# Patient Record
Sex: Male | Born: 1975 | Race: White | Hispanic: No | Smoking: Never smoker
Health system: Southern US, Community
[De-identification: ages and names within clinical notes are randomized; demographics above are authoritative.]

## PROBLEM LIST (undated history)

## (undated) DIAGNOSIS — E785 Hyperlipidemia, unspecified: Secondary | ICD-10-CM

## (undated) DIAGNOSIS — I10 Essential (primary) hypertension: Secondary | ICD-10-CM

## (undated) HISTORY — DX: Hyperlipidemia, unspecified: E78.5

## (undated) HISTORY — DX: Essential (primary) hypertension: I10

---

## 1997-10-04 ENCOUNTER — Emergency Department (HOSPITAL_COMMUNITY): Admission: EM | Admit: 1997-10-04 | Discharge: 1997-10-04 | Payer: Self-pay | Admitting: Emergency Medicine

## 1998-06-15 ENCOUNTER — Encounter: Payer: Self-pay | Admitting: Emergency Medicine

## 1998-06-15 ENCOUNTER — Emergency Department (HOSPITAL_COMMUNITY): Admission: EM | Admit: 1998-06-15 | Discharge: 1998-06-15 | Payer: Self-pay | Admitting: Emergency Medicine

## 1998-06-24 ENCOUNTER — Emergency Department (HOSPITAL_COMMUNITY): Admission: EM | Admit: 1998-06-24 | Discharge: 1998-06-24 | Payer: Self-pay | Admitting: Emergency Medicine

## 1999-03-17 ENCOUNTER — Emergency Department (HOSPITAL_COMMUNITY): Admission: EM | Admit: 1999-03-17 | Discharge: 1999-03-17 | Payer: Self-pay | Admitting: Internal Medicine

## 2000-08-05 ENCOUNTER — Emergency Department (HOSPITAL_COMMUNITY): Admission: EM | Admit: 2000-08-05 | Discharge: 2000-08-05 | Payer: Self-pay | Admitting: Emergency Medicine

## 2000-08-05 ENCOUNTER — Encounter: Payer: Self-pay | Admitting: Emergency Medicine

## 2000-08-10 ENCOUNTER — Emergency Department (HOSPITAL_COMMUNITY): Admission: EM | Admit: 2000-08-10 | Discharge: 2000-08-10 | Payer: Self-pay | Admitting: Emergency Medicine

## 2001-12-31 ENCOUNTER — Emergency Department (HOSPITAL_COMMUNITY): Admission: EM | Admit: 2001-12-31 | Discharge: 2001-12-31 | Payer: Self-pay | Admitting: *Deleted

## 2002-09-17 ENCOUNTER — Emergency Department (HOSPITAL_COMMUNITY): Admission: EM | Admit: 2002-09-17 | Discharge: 2002-09-17 | Payer: Self-pay | Admitting: Emergency Medicine

## 2003-12-11 ENCOUNTER — Emergency Department (HOSPITAL_COMMUNITY): Admission: EM | Admit: 2003-12-11 | Discharge: 2003-12-12 | Payer: Self-pay | Admitting: *Deleted

## 2006-12-08 ENCOUNTER — Encounter: Admission: RE | Admit: 2006-12-08 | Discharge: 2006-12-29 | Payer: Self-pay | Admitting: Family Medicine

## 2007-02-25 ENCOUNTER — Emergency Department (HOSPITAL_COMMUNITY): Admission: EM | Admit: 2007-02-25 | Discharge: 2007-02-25 | Payer: Self-pay | Admitting: Family Medicine

## 2009-03-16 ENCOUNTER — Emergency Department (HOSPITAL_COMMUNITY): Admission: EM | Admit: 2009-03-16 | Discharge: 2009-03-16 | Payer: Self-pay | Admitting: Family Medicine

## 2011-02-28 LAB — POCT H PYLORI SCREEN: H. PYLORI SCREEN, POC: NEGATIVE

## 2011-04-18 ENCOUNTER — Emergency Department (INDEPENDENT_AMBULATORY_CARE_PROVIDER_SITE_OTHER)
Admission: EM | Admit: 2011-04-18 | Discharge: 2011-04-18 | Disposition: A | Discharge status: Home / Self Care | Payer: Managed Care, Other (non HMO) | Source: Home / Self Care | Attending: Family Medicine | Admitting: Family Medicine

## 2011-04-18 ENCOUNTER — Encounter: Payer: Self-pay | Admitting: *Deleted

## 2011-04-18 DIAGNOSIS — J069 Acute upper respiratory infection, unspecified: Secondary | ICD-10-CM

## 2011-04-18 MED ORDER — AZITHROMYCIN 250 MG PO TABS: 250.0000 mg | ORAL_TABLET | Freq: Every day | ORAL | Status: AC

## 2011-04-18 MED ORDER — GUAIFENESIN-CODEINE 100-10 MG/5ML PO SYRP: 5.0000 mL | ORAL_SOLUTION | Freq: Four times a day (QID) | ORAL | Status: AC | PRN

## 2011-04-18 NOTE — ED Notes (Signed)
Pt  Reports  Symptoms  Of  Cough  Body  Aches     sorethroat  Had  Fever  yest  But not today     Pt   Is  In no  Acute  Distress  Sitting upright on exam table  Speaking in complete  sentances

## 2011-04-18 NOTE — ED Provider Notes (Signed)
History     CSN: [id number] Arrival date & time: 04/18/2011 12:51 PM   First MD Initiated Contact with Patient 04/18/11 1327      Chief Complaint  Patient presents with  . Cough    (Consider location/radiation/quality/duration/timing/severity/associated sxs/prior treatment) Patient is a 35 y.o. male presenting with cough. The history is provided by the patient.  Cough The current episode started yesterday. The problem occurs constantly. The problem has been gradually worsening. The cough is non-productive. The maximum temperature recorded prior to his arrival was 100 to 100.9 F. He is not a smoker.  states sore throat is resolving. Has had some runny nose.   History reviewed. No pertinent past medical history.  History reviewed. No pertinent past surgical history.  Family History  Problem Relation Age of Onset  . Hypertension Father     History  Substance Use Topics  . Smoking status: Not on file  . Smokeless tobacco: Current User  . Alcohol Use: Yes     occasonal      Review of Systems  HENT: Negative.   Respiratory: Positive for cough.   Cardiovascular: Negative.   Gastrointestinal: Negative.   Genitourinary: Negative.   Skin: Negative.     Allergies  Review of patient's allergies indicates no known allergies.  Home Medications   Current Outpatient Rx  Name Route Sig Dispense Refill  . AZITHROMYCIN 250 MG PO TABS Oral Take 1 tablet (250 mg total) by mouth daily. Take first 2 tablets together, then 1 every day until finished. 6 tablet 0  . GUAIFENESIN-CODEINE 100-10 MG/5ML PO SYRP Oral Take 5 mLs by mouth every 6 (six) hours as needed for cough. 120 mL 0    BP 126/86  Pulse 76  Temp(Src) 98.8 F (37.1 C) (Oral)  Resp 20  SpO2 99%  Physical Exam  Constitutional: He appears well-developed and well-nourished. He appears distressed.  Neck: Normal range of motion. Neck supple.  Cardiovascular: Normal rate.   Lymphadenopathy:    He has no cervical  adenopathy.  Skin: Skin is warm.  congested cough. Lungs clear  ED Course  Procedures (including critical care time)  Labs Reviewed - No data to display No results found.   1. URI (upper respiratory infection)       MDM          Randa Spike, MD 04/18/11 1428

## 2015-05-30 ENCOUNTER — Emergency Department (HOSPITAL_COMMUNITY): Payer: Managed Care, Other (non HMO)

## 2015-05-30 ENCOUNTER — Emergency Department (HOSPITAL_COMMUNITY)
Admission: EM | Admit: 2015-05-30 | Discharge: 2015-05-30 | Disposition: A | Discharge status: Home / Self Care | Payer: Managed Care, Other (non HMO) | Attending: Emergency Medicine | Admitting: Emergency Medicine

## 2015-05-30 ENCOUNTER — Encounter (HOSPITAL_COMMUNITY): Payer: Self-pay | Admitting: Emergency Medicine

## 2015-05-30 DIAGNOSIS — T50905A Adverse effect of unspecified drugs, medicaments and biological substances, initial encounter: Secondary | ICD-10-CM

## 2015-05-30 DIAGNOSIS — R55 Syncope and collapse: Secondary | ICD-10-CM | POA: Diagnosis not present

## 2015-05-30 DIAGNOSIS — T43215A Adverse effect of selective serotonin and norepinephrine reuptake inhibitors, initial encounter: Secondary | ICD-10-CM | POA: Insufficient documentation

## 2015-05-30 DIAGNOSIS — F1510 Other stimulant abuse, uncomplicated: Secondary | ICD-10-CM | POA: Insufficient documentation

## 2015-05-30 DIAGNOSIS — R200 Anesthesia of skin: Secondary | ICD-10-CM | POA: Insufficient documentation

## 2015-05-30 DIAGNOSIS — R479 Unspecified speech disturbances: Secondary | ICD-10-CM | POA: Insufficient documentation

## 2015-05-30 DIAGNOSIS — T39315A Adverse effect of propionic acid derivatives, initial encounter: Secondary | ICD-10-CM | POA: Diagnosis not present

## 2015-05-30 DIAGNOSIS — R079 Chest pain, unspecified: Secondary | ICD-10-CM | POA: Insufficient documentation

## 2015-05-30 DIAGNOSIS — R231 Pallor: Secondary | ICD-10-CM | POA: Insufficient documentation

## 2015-05-30 LAB — COMPREHENSIVE METABOLIC PANEL
ALK PHOS: 53 U/L (ref 38–126)
ALT: 57 U/L (ref 17–63)
AST: 35 U/L (ref 15–41)
Albumin: 3.9 g/dL (ref 3.5–5.0)
Anion gap: 12 (ref 5–15)
BILIRUBIN TOTAL: 1 mg/dL (ref 0.3–1.2)
BUN: 13 mg/dL (ref 6–20)
CALCIUM: 9 mg/dL (ref 8.9–10.3)
CHLORIDE: 102 mmol/L (ref 101–111)
CO2: 25 mmol/L (ref 22–32)
CREATININE: 1.2 mg/dL (ref 0.61–1.24)
GFR calc Af Amer: >60 mL/min (ref >60)
Glucose, Bld: 135 mg/dL — ABNORMAL HIGH (ref 65–99)
Potassium: 4.2 mmol/L (ref 3.5–5.1)
Sodium: 139 mmol/L (ref 135–145)
TOTAL PROTEIN: 6.1 g/dL — AB (ref 6.5–8.1)

## 2015-05-30 LAB — CBC WITH DIFFERENTIAL/PLATELET
BASOS ABS: 0.1 10*3/uL (ref 0.0–0.1)
Basophils Relative: 1 %
Eosinophils Absolute: 0.2 10*3/uL (ref 0.0–0.7)
Eosinophils Relative: 3 %
HEMATOCRIT: 42.4 % (ref 39.0–52.0)
Hemoglobin: 14.3 g/dL (ref 13.0–17.0)
LYMPHS ABS: 1.9 10*3/uL (ref 0.7–4.0)
LYMPHS PCT: 25 %
MCH: 27.8 pg (ref 26.0–34.0)
MCHC: 33.7 g/dL (ref 30.0–36.0)
MCV: 82.3 fL (ref 78.0–100.0)
Monocytes Absolute: 0.6 10*3/uL (ref 0.1–1.0)
Monocytes Relative: 8 %
NEUTROS ABS: 4.8 10*3/uL (ref 1.7–7.7)
Neutrophils Relative %: 63 %
Platelets: 223 10*3/uL (ref 150–400)
RBC: 5.15 MIL/uL (ref 4.22–5.81)
RDW: 12.9 % (ref 11.5–15.5)
WBC: 7.5 10*3/uL (ref 4.0–10.5)

## 2015-05-30 LAB — RAPID URINE DRUG SCREEN, HOSP PERFORMED
Amphetamines: POSITIVE — AB
BARBITURATES: NOT DETECTED
BENZODIAZEPINES: NOT DETECTED
COCAINE: NOT DETECTED
Opiates: NOT DETECTED
Tetrahydrocannabinol: NOT DETECTED

## 2015-05-30 LAB — CBG MONITORING, ED: GLUCOSE-CAPILLARY: 111 mg/dL — AB (ref 65–99)

## 2015-05-30 LAB — URINALYSIS, ROUTINE W REFLEX MICROSCOPIC
Bilirubin Urine: NEGATIVE
GLUCOSE, UA: NEGATIVE mg/dL
Hgb urine dipstick: NEGATIVE
Ketones, ur: NEGATIVE mg/dL
LEUKOCYTES UA: NEGATIVE
Nitrite: NEGATIVE
PROTEIN: NEGATIVE mg/dL
SPECIFIC GRAVITY, URINE: 1.018 (ref 1.005–1.030)
pH: 7 (ref 5.0–8.0)

## 2015-05-30 LAB — MAGNESIUM: Magnesium: 1.9 mg/dL (ref 1.7–2.4)

## 2015-05-30 LAB — I-STAT CG4 LACTIC ACID, ED: LACTIC ACID, VENOUS: 1.08 mmol/L (ref 0.5–2.0)

## 2015-05-30 LAB — LIPASE, BLOOD: LIPASE: 173 U/L — AB (ref 11–51)

## 2015-05-30 MED ORDER — IOHEXOL 300 MG/ML  SOLN
100.0000 mL | Freq: Once | INTRAMUSCULAR | Status: AC | PRN
  Administered 2015-05-30: 100 mL via INTRAVENOUS

## 2015-05-30 MED ORDER — MAGNESIUM SULFATE 2 GM/50ML IV SOLN: 2.0000 g | Freq: Once | INTRAVENOUS | Status: DC

## 2015-05-30 MED ORDER — KETOROLAC TROMETHAMINE 30 MG/ML IJ SOLN
30.0000 mg | Freq: Once | INTRAMUSCULAR | Status: AC
  Administered 2015-05-30: 30 mg via INTRAVENOUS
  Filled 2015-05-30: qty 1

## 2015-05-30 MED ORDER — SODIUM CHLORIDE 0.9 % IV BOLUS (SEPSIS)
1000.0000 mL | Freq: Once | INTRAVENOUS | Status: AC
  Administered 2015-05-30: 1000 mL via INTRAVENOUS

## 2015-05-30 NOTE — ED Notes (Signed)
Pt. Transported to CT 

## 2015-05-30 NOTE — Discharge Instructions (Signed)
Trazodone extended release oral tablets Mr. Isaac Dickson, her symptoms tonight are likely related to the trazodone pill. Consider Benadryl to help you sleep at night. See your primary care physician within 3 days for close follow-up. If any symptoms worsen come back to the emergency department immediately. Thank you. What is this medicine? TRAZODONE (TRAZ oh done) is used to treat depression. This medicine may be used for other purposes; ask your health care provider or pharmacist if you have questions. What should I tell my health care provider before I take this medicine? They need to know if you have any of these conditions: -attempted suicide or thinking about it -bipolar disorder -bleeding problems -glaucoma -heart disease, or previous heart attack -irregular heart beat -kidney disease -liver disease -low levels of sodium in the blood -an unusual or allergic reaction to trazodone, other medicines, foods, dyes or preservatives -pregnant or trying to get pregnant -breast-feeding How should I use this medicine? Take this medicine by mouth with a glass of water. Follow the directions on the prescription label. Take this medicine on an empty stomach, at least 30 minutes before or 2 hours after food. Do not take with food. Do not crush or chew this medicine. You may break in half along the score line. Take your medicine at bedtime everyday. Do not take your medicine more often than directed. Do not stop taking this medicine suddenly except upon the advice of your doctor. Stopping this medicine too quickly may cause serious side effects or your condition may worsen. A special MedGuide will be given to you by the pharmacist with each prescription and refill. Be sure to read this information carefully each time. Talk to your pediatrician regarding the use of this medicine in children. Special care may be needed. Overdosage: If you think you have taken too much of this medicine contact a poison control  center or emergency room at once. NOTE: This medicine is only for you. Do not share this medicine with others. What if I miss a dose? If you miss a dose, take it as soon as you can. If it is almost time for your next dose, take only that dose. Do not take double or extra doses. What may interact with this medicine? Do not take this medicine with any of the following medications: -certain medicines for fungal infections like fluconazole, itraconazole, ketoconazole, posaconazole, voriconazole -cisapride -dofetilide -dronedarone -linezolid -MAOIs like Carbex, Eldepryl, Marplan, Nardil, and Parnate -mesoridazine -methylene blue (injected into a vein) -pimozide -saquinavir -thioridazine -ziprasidone This medicine may also interact with the following medications: -alcohol -antiviral medicines for HIV or AIDS -aspirin and aspirin-like medicines -barbiturates like phenobarbital -certain medicines for blood pressure, heart disease, irregular heart beat -certain medicines for depression, anxiety, or psychotic disturbances -certain medicines for migraine headache like almotriptan, eletriptan, frovatriptan, naratriptan, rizatriptan, sumatriptan, zolmitriptan -certain medicines for seizures like carbamazepine, phenytoin -certain medicines for sleep -certain medicines that treat or prevent blood clots like dalteparin, enoxaparin, warfarin -digoxin -fentanyl -lithium -NSAIDS, medicines for pain and inflammation, like ibuprofen or naproxen -other medicines that prolong the QT interval (cause an abnormal heart rhythm) -rasagiline -supplements like St. John's wort, kava kava, valerian -tramadol -tryptophan This list may not describe all possible interactions. Give your health care provider a list of all the medicines, herbs, non-prescription drugs, or dietary supplements you use. Also tell them if you smoke, drink alcohol, or use illegal drugs. Some items may interact with your medicine. What  should I watch for while using this medicine? Tell your  doctor if your symptoms do not get better or if they get worse. Visit your doctor or health care professional for regular checks on your progress. Because it may take several weeks to see the full effects of this medicine, it is important to continue your treatment as prescribed by your doctor. Patients and their families should watch out for new or worsening thoughts of suicide or depression. Also watch out for sudden changes in feelings such as feeling anxious, agitated, panicky, irritable, hostile, aggressive, impulsive, severely restless, overly excited and hyperactive, or not being able to sleep. If this happens, especially at the beginning of treatment or after a change in dose, call your health care professional. Bonita QuinYou may get drowsy or dizzy. Do not drive, use machinery, or do anything that needs mental alertness until you know how this medicine affects you. Do not stand or sit up quickly, especially if you are an older patient. This reduces the risk of dizzy or fainting spells. Alcohol may interfere with the effect of this medicine. Avoid alcoholic drinks. This medicine may cause dry eyes and blurred vision. If you wear contact lenses you may feel some discomfort. Lubricating drops may help. See your eye doctor if the problem does not go away or is severe. Your mouth may get dry. Chewing sugarless gum, sucking hard candy and drinking plenty of water may help. Contact your doctor if the problem does not go away or is severe. What side effects may I notice from receiving this medicine? Side effects that you should report to your doctor or health care professional as soon as possible: -allergic reactions like skin rash, itching or hives, swelling of the face, lips, or tongue -fast, irregular heartbeat -feeling faint or lightheaded, falls -painful erections or other sexual dysfunction -suicidal thoughts or other mood changes -trembling Side  effects that usually do not require medical attention (Report these to your doctor or health care professional if they continue or are bothersome.): -constipation -headache -muscle aches or pains -nausea, vomiting -unusually weak or tired This list may not describe all possible side effects. Call your doctor for medical advice about side effects. You may report side effects to FDA at 1-800-FDA-1088. Where should I keep my medicine? Keep out of the reach of children. Store at room temperature between 15 and 30 degrees C (59 to 86 degrees F). Protect from light. Keep container tightly closed. Throw away any unused medicine after the expiration date. NOTE: This sheet is a summary. It may not cover all possible information. If you have questions about this medicine, talk to your doctor, pharmacist, or health care provider.    2016, Elsevier/Gold Standard. (2012-12-07 15:45:24) Insomnia Insomnia is a sleep disorder that makes it difficult to fall asleep or to stay asleep. Insomnia can cause tiredness (fatigue), low energy, difficulty concentrating, mood swings, and poor performance at work or school.  There are three different ways to classify insomnia:  Difficulty falling asleep.  Difficulty staying asleep.  Waking up too early in the morning. Any type of insomnia can be long-term (chronic) or short-term (acute). Both are common. Short-term insomnia usually lasts for three months or less. Chronic insomnia occurs at least three times a week for longer than three months. CAUSES  Insomnia may be caused by another condition, situation, or substance, such as:  Anxiety.  Certain medicines.  Gastroesophageal reflux disease (GERD) or other gastrointestinal conditions.  Asthma or other breathing conditions.  Restless legs syndrome, sleep apnea, or other sleep disorders.  Chronic pain.  Menopause. This may include hot flashes.  Stroke.  Abuse of alcohol, tobacco, or illegal  drugs.  Depression.  Caffeine.   Neurological disorders, such as Alzheimer disease.  An overactive thyroid (hyperthyroidism). The cause of insomnia may not be known. RISK FACTORS Risk factors for insomnia include:  Gender. Women are more commonly affected than men.  Age. Insomnia is more common as you get older.  Stress. This may involve your professional or personal life.  Income. Insomnia is more common in people with lower income.  Lack of exercise.   Irregular work schedule or night shifts.  Traveling between different time zones. SIGNS AND SYMPTOMS If you have insomnia, trouble falling asleep or trouble staying asleep is the main symptom. This may lead to other symptoms, such as:  Feeling fatigued.  Feeling nervous about going to sleep.  Not feeling rested in the morning.  Having trouble concentrating.  Feeling irritable, anxious, or depressed. TREATMENT  Treatment for insomnia depends on the cause. If your insomnia is caused by an underlying condition, treatment will focus on addressing the condition. Treatment may also include:   Medicines to help you sleep.  Counseling or therapy.  Lifestyle adjustments. HOME CARE INSTRUCTIONS   Take medicines only as directed by your health care provider.  Keep regular sleeping and waking hours. Avoid naps.  Keep a sleep diary to help you and your health care provider figure out what could be causing your insomnia. Include:   When you sleep.  When you wake up during the night.  How well you sleep.   How rested you feel the next day.  Any side effects of medicines you are taking.  What you eat and drink.   Make your bedroom a comfortable place where it is easy to fall asleep:  Put up shades or special blackout curtains to block light from outside.  Use a white noise machine to block noise.  Keep the temperature cool.   Exercise regularly as directed by your health care provider. Avoid exercising  right before bedtime.  Use relaxation techniques to manage stress. Ask your health care provider to suggest some techniques that may work well for you. These may include:  Breathing exercises.  Routines to release muscle tension.  Visualizing peaceful scenes.  Cut back on alcohol, caffeinated beverages, and cigarettes, especially close to bedtime. These can disrupt your sleep.  Do not overeat or eat spicy foods right before bedtime. This can lead to digestive discomfort that can make it hard for you to sleep.  Limit screen use before bedtime. This includes:  Watching TV.  Using your smartphone, tablet, and computer.  Stick to a routine. This can help you fall asleep faster. Try to do a quiet activity, brush your teeth, and go to bed at the same time each night.  Get out of bed if you are still awake after 15 minutes of trying to sleep. Keep the lights down, but try reading or doing a quiet activity. When you feel sleepy, go back to bed.  Make sure that you drive carefully. Avoid driving if you feel very sleepy.  Keep all follow-up appointments as directed by your health care provider. This is important. SEEK MEDICAL CARE IF:   You are tired throughout the day or have trouble in your daily routine due to sleepiness.  You continue to have sleep problems or your sleep problems get worse. SEEK IMMEDIATE MEDICAL CARE IF:   You have serious thoughts about hurting yourself or someone else.  This information is not intended to replace advice given to you by your health care provider. Make sure you discuss any questions you have with your health care provider.   Document Released: 05/03/2000 Document Revised: 01/25/2015 Document Reviewed: 02/04/2014 Elsevier Interactive Patient Education [employer]
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
## 2016-03-24 ENCOUNTER — Encounter (HOSPITAL_COMMUNITY): Payer: Self-pay | Admitting: Emergency Medicine

## 2016-03-24 ENCOUNTER — Emergency Department (HOSPITAL_COMMUNITY): Payer: Managed Care, Other (non HMO)

## 2016-03-24 ENCOUNTER — Emergency Department (HOSPITAL_COMMUNITY)
Admission: EM | Admit: 2016-03-24 | Discharge: 2016-03-25 | Disposition: A | Discharge status: Home / Self Care | Payer: Managed Care, Other (non HMO) | Attending: Emergency Medicine | Admitting: Emergency Medicine

## 2016-03-24 DIAGNOSIS — E860 Dehydration: Secondary | ICD-10-CM | POA: Diagnosis not present

## 2016-03-24 DIAGNOSIS — R55 Syncope and collapse: Secondary | ICD-10-CM | POA: Diagnosis not present

## 2016-03-24 DIAGNOSIS — R42 Dizziness and giddiness: Secondary | ICD-10-CM | POA: Diagnosis present

## 2016-03-24 LAB — URINALYSIS, ROUTINE W REFLEX MICROSCOPIC
BILIRUBIN URINE: NEGATIVE
Glucose, UA: NEGATIVE mg/dL
HGB URINE DIPSTICK: NEGATIVE
Ketones, ur: NEGATIVE mg/dL
Leukocytes, UA: NEGATIVE
Nitrite: NEGATIVE
PH: 7 (ref 5.0–8.0)
Protein, ur: NEGATIVE mg/dL
SPECIFIC GRAVITY, URINE: 1.023 (ref 1.005–1.030)

## 2016-03-24 LAB — I-STAT CG4 LACTIC ACID, ED: Lactic Acid, Venous: 0.97 mmol/L (ref 0.5–1.9)

## 2016-03-24 LAB — CBC WITH DIFFERENTIAL/PLATELET
Basophils Absolute: 0 10*3/uL (ref 0.0–0.1)
Basophils Relative: 0 %
Eosinophils Absolute: 0.1 10*3/uL (ref 0.0–0.7)
Eosinophils Relative: 1 %
HEMATOCRIT: 43.6 % (ref 39.0–52.0)
HEMOGLOBIN: 14.4 g/dL (ref 13.0–17.0)
LYMPHS ABS: 1.3 10*3/uL (ref 0.7–4.0)
LYMPHS PCT: 10 %
MCH: 27.5 pg (ref 26.0–34.0)
MCHC: 33 g/dL (ref 30.0–36.0)
MCV: 83.2 fL (ref 78.0–100.0)
MONOS PCT: 7 %
Monocytes Absolute: 0.9 10*3/uL (ref 0.1–1.0)
NEUTROS ABS: 10.9 10*3/uL — AB (ref 1.7–7.7)
NEUTROS PCT: 82 %
Platelets: 159 10*3/uL (ref 150–400)
RBC: 5.24 MIL/uL (ref 4.22–5.81)
RDW: 12.6 % (ref 11.5–15.5)
WBC: 13.3 10*3/uL — ABNORMAL HIGH (ref 4.0–10.5)

## 2016-03-24 LAB — COMPREHENSIVE METABOLIC PANEL
ALBUMIN: 4.1 g/dL (ref 3.5–5.0)
ALK PHOS: 54 U/L (ref 38–126)
ALT: 25 U/L (ref 17–63)
ANION GAP: 8 (ref 5–15)
AST: 22 U/L (ref 15–41)
BILIRUBIN TOTAL: 0.8 mg/dL (ref 0.3–1.2)
BUN: 15 mg/dL (ref 6–20)
CALCIUM: 9.4 mg/dL (ref 8.9–10.3)
CO2: 30 mmol/L (ref 22–32)
Chloride: 103 mmol/L (ref 101–111)
Creatinine, Ser: 0.99 mg/dL (ref 0.61–1.24)
GFR calc non Af Amer: >60 mL/min (ref >60)
GLUCOSE: 134 mg/dL — AB (ref 65–99)
Potassium: 3.4 mmol/L — ABNORMAL LOW (ref 3.5–5.1)
Sodium: 141 mmol/L (ref 135–145)
TOTAL PROTEIN: 6.3 g/dL — AB (ref 6.5–8.1)

## 2016-03-24 LAB — CK: Total CK: 132 U/L (ref 49–397)

## 2016-03-24 MED ORDER — SODIUM CHLORIDE 0.9 % IV BOLUS (SEPSIS)
1000.0000 mL | Freq: Once | INTRAVENOUS | Status: AC
  Administered 2016-03-24: 1000 mL via INTRAVENOUS

## 2016-03-24 MED ORDER — IOPAMIDOL (ISOVUE-370) INJECTION 76%
INTRAVENOUS | Status: AC
  Filled 2016-03-24: qty 50
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
## 2016-03-24 NOTE — ED Notes (Signed)
Patient transported to CT 

## 2016-03-24 NOTE — ED Triage Notes (Signed)
Per EMS, pt has been experiencing neck pain for 4-5 days which has triggered headaches.  Yesterday he did drink ETOH, today is experiencing n/v.  Tonight he got up out of the chair and felt dizzy, slid off the bed to the floor, no LOC.  Per EMS pt was pale, pastie and diaphoric when they arrived. CBG 126, 12 lead unremarkable.

## 2016-03-25 NOTE — Discharge Instructions (Signed)
1. Medications: usual home medications 2. Treatment: rest, drink plenty of fluids,  3. Follow Up: Please followup with your primary doctor in 5-7 days for discussion of your diagnoses and further evaluation after today's visit; if you do not have a primary care doctor use the resource guide provided to find one; Please return to the ER for return or symptoms or development of other concerning symptoms

## 2016-03-25 NOTE — ED Notes (Signed)
Pt able to handle fluids and steady on his feet.
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
## 2019-06-16 NOTE — Progress Notes (Deleted)
    Patient referred by Shanon Rosser, PA-C for ***  Subjective:   Isaac Dickson, male    DOB: 1975/10/20, 44 y.o.   MRN: [id number]  *** No chief complaint on file.   *** HPI  44 y.o. *** male with ***  *** No past medical history on file.  *** No past surgical history on file.  *** Social History   Tobacco Use  Smoking Status Never Smoker  Smokeless Tobacco Current User  . Types: Chew    Social History   Substance and Sexual Activity  Alcohol Use Yes   Comment: occasonal    *** Family History  Problem Relation Age of Onset  . Hypertension Father     *** Current Outpatient Medications on File Prior to Visit  Medication Sig Dispense Refill  . amphetamine-dextroamphetamine (ADDERALL) 10 MG tablet Take 10 mg by mouth 2 (two) times daily with a meal.     No current facility-administered medications on file prior to visit.    Cardiovascular and other pertinent studies: Pt had a chest Xray on 05/19/2019  *** EKG ***/***/202***: 01/18/2019 ***  Recent labs: 05/20/2019: Glucose 93, BUN/Cr 16/1.15. EGFR 79. Na/K 139/4.5. Rest of the CMP normal H/H 14.8/44.2. MCV 85.5. Platelets 210 ***HbA1C%*** Chol 262, TG 171, HDL 59, LDL 171 TSH 1.65 normal   *** ROS      *** There were no vitals filed for this visit.   There is no height or weight on file to calculate BMI. There were no vitals filed for this visit.  *** Objective:   Physical Exam    ***     Assessment & Recommendations:   ***  ***     Thank you for referring the patient to [place]. Please feel free to contact with any questions.  Nigel Mormon, MD [facility] Pager: [phone] Office: [phone]

## 2019-06-17 ENCOUNTER — Other Ambulatory Visit: Payer: Self-pay

## 2019-06-17 ENCOUNTER — Encounter: Payer: Self-pay | Admitting: Cardiology

## 2019-06-17 ENCOUNTER — Ambulatory Visit: Payer: Managed Care, Other (non HMO) | Admitting: Cardiology

## 2019-06-17 VITALS — BP 112/88 | HR 88 | Temp 98.4°F | Resp 16 | Ht 72.0 in | Wt 237.0 lb

## 2019-06-17 DIAGNOSIS — E782 Mixed hyperlipidemia: Secondary | ICD-10-CM

## 2019-06-17 DIAGNOSIS — R55 Syncope and collapse: Secondary | ICD-10-CM | POA: Diagnosis not present

## 2019-06-17 DIAGNOSIS — R0789 Other chest pain: Secondary | ICD-10-CM

## 2019-06-17 DIAGNOSIS — I10 Essential (primary) hypertension: Secondary | ICD-10-CM

## 2019-06-17 MED ORDER — ROSUVASTATIN CALCIUM 20 MG PO TABS: 20.0000 mg | ORAL_TABLET | Freq: Every day | ORAL | 2 refills | Status: DC
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
## 2019-08-26 ENCOUNTER — Ambulatory Visit: Payer: Managed Care, Other (non HMO) | Admitting: Cardiology
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
## 2019-09-20 ENCOUNTER — Ambulatory Visit: Payer: Managed Care, Other (non HMO) | Admitting: Cardiology

## 2020-01-05 ENCOUNTER — Other Ambulatory Visit: Payer: Self-pay | Admitting: Cardiology

## 2020-01-05 DIAGNOSIS — E782 Mixed hyperlipidemia: Secondary | ICD-10-CM

## 2020-02-10 NOTE — Progress Notes (Signed)
Primary Physician/Referring:  Shanon Rosser, PA-C  Patient ID: Isaac Dickson, male    DOB: 1976/04/16, 44 y.o.   MRN: [id number]  Chief Complaint  Patient presents with  . Chest Pain  . Follow-up    Referred by Shanon Rosser, PA   HPI:    Isaac Dickson  is a 44 y.o. male with history of tobacco dependence, hypertension, hyperlipidemia, allergic rhinitis, GERD, dysphagia, ADHD, anxiety and depression.  He was originally referred to our office on 06/17/2019 for atypical chest pain.   Patient presents today with complaint of continued chest pain with activity and at rest that lasts several seconds, relieved with deep breathing.  He also reports associated shortness of breath.  Denies palpitations, dizziness, syncope, swelling.  He continues to use tobacco on a daily basis.  Reports high-calorie high-fat diet, as he frequently eats outside the house.  Denies history of diabetes, CKD.  Continues to take Crestor 20 mg daily and lisinopril 10 mg daily.  He does not monitor his blood pressure at home.  He has not had stress test or echocardiogram.   History reviewed. No pertinent past medical history. History reviewed. No pertinent surgical history. Family History  Problem Relation Age of Onset  . Hypertension Father   . Lung cancer Father     Social History   Tobacco Use  . Smoking status: Never Smoker  . Smokeless tobacco: Current User    Types: Chew  Substance Use Topics  . Alcohol use: Yes    Comment: occasonal   Marital Status: Married   ROS  Review of Systems  Cardiovascular: Positive for chest pain. Negative for claudication, dyspnea on exertion, leg swelling, orthopnea, palpitations, paroxysmal nocturnal dyspnea and syncope.  Respiratory: Positive for shortness of breath.   Hematologic/Lymphatic: Does not bruise/bleed easily.  Neurological: Positive for dizziness.    Objective  Blood pressure 118/82, pulse 78, resp. rate 16, height 6' (1.829 m), weight 232 lb 4.8 oz (105.4  kg), SpO2 97 %.  Vitals with BMI 02/11/2020 06/17/2019 03/25/2016  Height 6' 0"  6' 0"  -  Weight 232 lbs 5 oz 237 lbs -  BMI 30.1 60.10 -  Systolic 932 355 732  Diastolic 82 88 90  Pulse 78 88 67    Physical Exam Vitals reviewed.  HENT:     Head: Normocephalic and atraumatic.  Cardiovascular:     Rate and Rhythm: Regular rhythm. Tachycardia present.     Pulses: Intact distal pulses.     Heart sounds: S1 normal and S2 normal. No murmur heard.  No gallop.      Comments: No leg edema.  Pulmonary:     Effort: Pulmonary effort is normal. No respiratory distress.     Breath sounds: No wheezing, rhonchi or rales.  Musculoskeletal:     Right lower leg: No edema.     Left lower leg: No edema.  Neurological:     Mental Status: He is alert.     Laboratory examination:   No results for input(s): NA, K, CL, CO2, GLUCOSE, BUN, CREATININE, CALCIUM, GFRNONAA, GFRAA in the last 8760 hours. CrCl cannot be calculated (Patient's most recent lab result is older than the maximum 21 days allowed.).  CMP Latest Ref Rng & Units 03/24/2016 05/30/2015  Glucose 65 - 99 mg/dL 134(H) 135(H)  BUN 6 - 20 mg/dL 15 13  Creatinine 0.61 - 1.24 mg/dL 0.99 1.20  Sodium 135 - 145 mmol/L 141 139  Potassium 3.5 - 5.1 mmol/L 3.4(L) 4.2  Chloride 101 - 111 mmol/L 103 102  CO2 22 - 32 mmol/L 30 25  Calcium 8.9 - 10.3 mg/dL 9.4 9.0  Total Protein 6.5 - 8.1 g/dL 6.3(L) 6.1(L)  Total Bilirubin 0.3 - 1.2 mg/dL 0.8 1.0  Alkaline Phos 38 - 126 U/L 54 53  AST 15 - 41 U/L 22 35  ALT 17 - 63 U/L 25 57   CBC Latest Ref Rng & Units 03/24/2016 05/30/2015  WBC 4.0 - 10.5 K/uL 13.3(H) 7.5  Hemoglobin 13.0 - 17.0 g/dL 14.4 14.3  Hematocrit 39 - 52 % 43.6 42.4  Platelets 150 - 400 K/uL 159 223    Lipid Panel No results for input(s): CHOL, TRIG, LDLCALC, VLDL, HDL, CHOLHDL, LDLDIRECT in the last 8760 hours.  HEMOGLOBIN A1C No results found for: HGBA1C, MPG TSH No results for input(s): TSH in the last 8760  hours.  External labs:  02/07/2020: BUN 18, creatinine 1.11, GFR 82, CMP otherwise within normal limits Hemoglobin 14.9, hematocrit 44.3, CBC otherwise within normal limits D-dimer negative   05/20/2019: Glucose93, BUN/Cr16/1.15. FFMB84. Na/K139/4.5. Rest of the CMP normal WBC 4.8 H/H14.8/44.2. MCV85.5. Platelets210 Chol262, TG171, HDL59, [id number] TSH1.65normal  Medications and allergies  No Known Allergies   Outpatient Medications Prior to Visit  Medication Sig Dispense Refill  . amphetamine-dextroamphetamine (ADDERALL) 10 MG tablet Take 10 mg by mouth 2 (two) times daily with a meal.    . Cholecalciferol 50 MCG (2000 UT) CAPS Take 1 capsule by mouth daily.    Marland Kitchen lisinopril (ZESTRIL) 10 MG tablet Take 1 tablet by mouth daily.    . rosuvastatin (CRESTOR) 20 MG tablet TAKE 1 TABLET(20 MG) BY MOUTH DAILY 30 tablet 2  . buPROPion (WELLBUTRIN XL) 150 MG 24 hr tablet Take 1 tablet by mouth daily.     No facility-administered medications prior to visit.     Radiology:   No results found.   Chest x-ray 02/07/2020 (External): Heart appears normal in size.  Questionable minimal atelectasis versus infiltrate versus scarring right upper lobe. Recommend follow-up chest x-ray in 2 to 3 weeks.  Cardiac Studies:   EKG EKG 02/11/2020: Sinus tachycardia at a rate of 114 bpm, left atrial enlargement.  Normal axis.  Incomplete right bundle branch block.  Poor R wave progression, cannot exclude anterior infarct old. Cannot exclude inferior infarct.  Abnormal EKG.  EKG 06/17/2019: Normal sinus rhythm at rate of 82 bpm, normal axis, cannot exclude inferior infarct old.  Incomplete right bundle branch block.  Poor R-wave progression, can't exclude anteroseptal infarct old.  No evidence of ischemia, normal QT interval.  Assessment     ICD-10-CM   1. Atypical chest pain  R07.89 EKG 12-Lead    PCV ECHOCARDIOGRAM COMPLETE    PCV MYOCARDIAL PERFUSION WO LEXISCAN  2. Mixed  hyperlipidemia  E78.2 Lipid Panel With LDL/HDL Ratio    PCV MYOCARDIAL PERFUSION WO LEXISCAN  3. Primary hypertension  I10 Lipid Panel With LDL/HDL Ratio    PCV MYOCARDIAL PERFUSION WO LEXISCAN  4. Abnormal EKG  R94.31 PCV ECHOCARDIOGRAM COMPLETE    PCV MYOCARDIAL PERFUSION WO LEXISCAN  5. Tobacco use  Z72.0      Medications Discontinued During This Encounter  Medication Reason  . buPROPion (WELLBUTRIN XL) 150 MG 24 hr tablet No longer needed (for PRN medications)    No orders of the defined types were placed in this encounter.   Recommendations:   Isaac Dickson is a 44 y.o. male with history of tobacco dependence, hypertension, hyperlipidemia, allergic rhinitis, GERD,  dysphagia, ADHD, anxiety and depression.  He was originally referred to our office on 06/17/2019 for atypical chest pain.   Patient presents today with atypical chest pain, and EKG revealing sinus tachycardia as well as Q waves in inferior leads, cannot exclude inferior infarct.  Patient has not had cardiac work-up for this, will obtain echocardiogram and nuclear stress test as I cannot exclude coronary ischemia as underlying etiology of patient's symptoms.  Patient's coronary artery disease risk factors include hypertension, tobacco use, hyperlipidemia, diet, sedentary lifestyle.  Discussed with patient precautions, signs, symptoms that should prompt him to report to the emergency department for evaluation.  Patient and wife expressed understanding.  Blood pressure is well controlled on lisinopril 10 mg daily, will continue this.  There is no lipid profile testing since increasing Crestor from 10 to 20 mg daily at last visit.  Will obtain lipid profile testing.  Discussed with patient importance of tobacco cessation as well as lifestyle modifications.  Patient expressed understanding.  He reports he currently has nicotine patches at home which he will use, instructed him to let the office know if he would like me to prescribe  additional patches.  Of note patient had chest x-ray ordered by his PCP on 02/07/2020 which noted questionable atelectasis versus infiltrate versus scarring in the right upper lobe.  It was recommended he have follow-up chest x-ray in 2 to 3 weeks.  Encouraged him to follow-up with his PCP regarding this.  Follow-up in 4 weeks for atypical chest pain, hyperlipidemia, and testing results.  Patient was seen in collaboration with Dr. Einar Gip. He also reviewed patient's chart and Dr. Einar Gip is in agreement of the plan.   During this visit I reviewed and updated: Tobacco history  allergies medication reconciliation  medical history  surgical history  family history  social history.  This note was created using a voice recognition software as a result there may be grammatical errors inadvertently enclosed that do not reflect the nature of this encounter. Every attempt is made to correct such errors.   Alethia Berthold, PA-C 02/11/2020, 10:40 AM Office: [phone]

## 2020-02-11 ENCOUNTER — Ambulatory Visit: Payer: Managed Care, Other (non HMO) | Admitting: Student

## 2020-02-11 ENCOUNTER — Other Ambulatory Visit: Payer: Self-pay

## 2020-02-11 ENCOUNTER — Encounter: Payer: Self-pay | Admitting: Student

## 2020-02-11 VITALS — BP 118/82 | HR 78 | Resp 16 | Ht 72.0 in | Wt 232.3 lb

## 2020-02-11 DIAGNOSIS — I10 Essential (primary) hypertension: Secondary | ICD-10-CM

## 2020-02-11 DIAGNOSIS — R0789 Other chest pain: Secondary | ICD-10-CM

## 2020-02-11 DIAGNOSIS — E782 Mixed hyperlipidemia: Secondary | ICD-10-CM

## 2020-02-11 DIAGNOSIS — R9431 Abnormal electrocardiogram [ECG] [EKG]: Secondary | ICD-10-CM

## 2020-02-11 DIAGNOSIS — Z720 Tobacco use: Secondary | ICD-10-CM

## 2020-02-11 NOTE — Patient Instructions (Signed)
Get labs at [facility] in a fasting state.

## 2020-02-17 ENCOUNTER — Other Ambulatory Visit: Payer: Self-pay

## 2020-02-17 ENCOUNTER — Ambulatory Visit: Payer: Managed Care, Other (non HMO)

## 2020-02-17 DIAGNOSIS — R9431 Abnormal electrocardiogram [ECG] [EKG]: Secondary | ICD-10-CM

## 2020-02-17 DIAGNOSIS — R0789 Other chest pain: Secondary | ICD-10-CM

## 2020-02-21 NOTE — Progress Notes (Signed)
Please inform patient echo was normal.

## 2020-02-21 NOTE — Progress Notes (Signed)
Spoke to patient, patient is aware sch/rma

## 2020-02-23 ENCOUNTER — Other Ambulatory Visit: Payer: Self-pay

## 2020-02-23 ENCOUNTER — Ambulatory Visit: Payer: Managed Care, Other (non HMO)

## 2020-02-23 DIAGNOSIS — R9431 Abnormal electrocardiogram [ECG] [EKG]: Secondary | ICD-10-CM

## 2020-02-23 DIAGNOSIS — I10 Essential (primary) hypertension: Secondary | ICD-10-CM

## 2020-02-23 DIAGNOSIS — R0789 Other chest pain: Secondary | ICD-10-CM

## 2020-02-23 DIAGNOSIS — E782 Mixed hyperlipidemia: Secondary | ICD-10-CM

## 2020-02-28 ENCOUNTER — Telehealth: Payer: Self-pay

## 2020-02-28 NOTE — Telephone Encounter (Signed)
Informed patient regarding results and pending labwork.

## 2020-02-28 NOTE — Telephone Encounter (Signed)
-----   Message from Rayford Halsted, [place] sent at 02/28/2020 10:20 AM EDT ----- Please inform patient normal stress test with no EKG or perfusion changes suggesting blockages in his heart arteries. Also please remind him he as active lab orders to check cholesterol.

## 2020-02-28 NOTE — Telephone Encounter (Signed)
-----   Message from Celeste C Cantwell, PA-C sent at 02/28/2020 10:20 AM EDT ----- Please inform patient normal stress test with no EKG or perfusion changes suggesting blockages in his heart arteries. Also please remind him he as active lab orders to check cholesterol.  

## 2020-02-28 NOTE — Progress Notes (Signed)
Please inform patient normal stress test with no EKG or perfusion changes suggesting blockages in his heart arteries. Also please remind him he as active lab orders to check cholesterol.

## 2020-03-09 NOTE — Progress Notes (Signed)
Primary Physician/Referring:  Shanon Rosser, PA-C  Patient ID: Isaac Dickson, male    DOB: [birth year], 44 y.o.   MRN: [id number]  Chief Complaint  Patient presents with  . Atypical chest pain  . Follow-up    4 weeks   . Results    test    HPI:    Isaac Dickson  is a 44 y.o. male with history of tobacco dependence, hypertension, hyperlipidemia, allergic rhinitis, GERD, dysphagia, ADHD, anxiety and depression.  He was originally referred to our office on 06/17/2019 for atypical chest pain.   Patient presents for 4-week follow-up of atypical chest pain, hyperlipidemia, and cardiac testing results.  Patient states he has had no recurrence of chest pain or shortness of breath since his last visit in our office.  Denies palpitations, dizziness, syncope, swelling.  Unfortunately he does continue to use tobacco on a daily basis and eats a high calorie high fat diet.  At last visit labs were ordered, however patient has been unable to have these done.  He does report occasional episodes of pain radiating from his neck to his head lasting approximately 30 seconds and associated with flushing.  Of note patient wears a smart watch which reportedly notes resting tachycardia at home, with patient stating his average heart rate at rest is 95-115 bpm.   No personal history of diabetes, MI, CVA, TIA.  According to patient no history of major surgery.  History reviewed. No pertinent past medical history. History reviewed. No pertinent surgical history. Family History  Problem Relation Age of Onset  . Hypertension Father   . Lung cancer Father     Social History   Tobacco Use  . Smoking status: Never Smoker  . Smokeless tobacco: Current User    Types: Chew  Substance Use Topics  . Alcohol use: Yes    Comment: occasonal   Marital Status: Married   ROS  Review of Systems  Cardiovascular: Negative for chest pain, claudication, dyspnea on exertion, leg swelling, orthopnea, palpitations,  paroxysmal nocturnal dyspnea and syncope.  Respiratory: Negative for shortness of breath.   Hematologic/Lymphatic: Does not bruise/bleed easily.  Musculoskeletal: Positive for neck pain.  Neurological: Negative for dizziness.    Objective  Blood pressure 118/86, pulse (!) 121, resp. rate 16, height 6' (1.829 m), weight 232 lb (105.2 kg), SpO2 97 %.  Vitals with BMI 03/10/2020 02/11/2020 06/17/2019  Height 6' 0"  6' 0"  6' 0"   Weight 232 lbs 232 lbs 5 oz 237 lbs  BMI 31.46 72.0 94.70  Systolic 962 836 629  Diastolic 86 82 88  Pulse 476 78 88    Physical Exam Vitals reviewed.  HENT:     Head: Normocephalic and atraumatic.  Cardiovascular:     Rate and Rhythm: Regular rhythm. Tachycardia present.     Pulses: Intact distal pulses.     Heart sounds: S1 normal and S2 normal. No murmur heard.  No gallop.      Comments: No leg edema.  Pulmonary:     Effort: Pulmonary effort is normal. No respiratory distress.     Breath sounds: No wheezing, rhonchi or rales.  Musculoskeletal:     Right lower leg: No edema.     Left lower leg: No edema.  Neurological:     Mental Status: He is alert.     Laboratory examination:   No results for input(s): NA, K, CL, CO2, GLUCOSE, BUN, CREATININE, CALCIUM, GFRNONAA, GFRAA in the last 8760 hours. CrCl cannot be calculated (Patient's  most recent lab result is older than the maximum 21 days allowed.).  CMP Latest Ref Rng & Units 03/24/2016 05/30/2015  Glucose 65 - 99 mg/dL 134(H) 135(H)  BUN 6 - 20 mg/dL 15 13  Creatinine 0.61 - 1.24 mg/dL 0.99 1.20  Sodium 135 - 145 mmol/L 141 139  Potassium 3.5 - 5.1 mmol/L 3.4(L) 4.2  Chloride 101 - 111 mmol/L 103 102  CO2 22 - 32 mmol/L 30 25  Calcium 8.9 - 10.3 mg/dL 9.4 9.0  Total Protein 6.5 - 8.1 g/dL 6.3(L) 6.1(L)  Total Bilirubin 0.3 - 1.2 mg/dL 0.8 1.0  Alkaline Phos 38 - 126 U/L 54 53  AST 15 - 41 U/L 22 35  ALT 17 - 63 U/L 25 57   CBC Latest Ref Rng & Units 03/24/2016 05/30/2015  WBC 4.0 - 10.5 K/uL  13.3(H) 7.5  Hemoglobin 13.0 - 17.0 g/dL 14.4 14.3  Hematocrit 39 - 52 % 43.6 42.4  Platelets 150 - 400 K/uL 159 223    Lipid Panel No results for input(s): CHOL, TRIG, LDLCALC, VLDL, HDL, CHOLHDL, LDLDIRECT in the last 8760 hours.  HEMOGLOBIN A1C No results found for: HGBA1C, MPG TSH No results for input(s): TSH in the last 8760 hours.  External labs:  02/07/2020: BUN 18, creatinine 1.11, GFR 82, CMP otherwise within normal limits Hemoglobin 14.9, hematocrit 44.3, CBC otherwise within normal limits D-dimer negative  05/20/2019: Glucose93, BUN/Cr16/1.15. ECXF07. Na/K139/4.5. Rest of the CMP normal WBC 4.8 H/H14.8/44.2. MCV85.5. Platelets210 Chol262, TG171, HDL59, [id number] TSH1.65normal  Medications and allergies  No Known Allergies   Outpatient Medications Prior to Visit  Medication Sig Dispense Refill  . amphetamine-dextroamphetamine (ADDERALL) 10 MG tablet Take 10 mg by mouth 2 (two) times daily with a meal.    . lisinopril (ZESTRIL) 10 MG tablet Take 1 tablet by mouth daily.    . rosuvastatin (CRESTOR) 20 MG tablet TAKE 1 TABLET(20 MG) BY MOUTH DAILY 30 tablet 2  . Cholecalciferol 50 MCG (2000 UT) CAPS Take 1 capsule by mouth daily.     No facility-administered medications prior to visit.   Radiology:   No results found.   Chest x-ray 02/07/2020 (External): Heart appears normal in size.  Questionable minimal atelectasis versus infiltrate versus scarring right upper lobe. Recommend follow-up chest x-ray in 2 to 3 weeks.  Cardiac Studies:  PCV MYOCARDIAL PERFUSION WO LEXISCAN 02/23/2020  Narrative Exercise Myoview stress test 02/23/2020: Exercise nuclear stress test was performed using Bruce protocol. Patient reached 10.6 METS, and 94% of age predicted maximum heart rate. Exercise capacity was good. 3/10 non-limiting chest pain reported. Heart rate and hemodynamic response were normal. Stress EKG revealed no ischemic changes. Normal myocardial perfusion.  Stress LVEF 55%. Low risk study.  PCV ECHOCARDIOGRAM COMPLETE 02/17/2020  Narrative Echocardiogram 02/17/2020: Normal LV systolic function with visual EF 55-60%. Left ventricle cavity is normal in size. Normal global wall motion. Normal diastolic filling pattern, normal LAP. No significant valvular heart disease. No prior study for comparison.    EKG:   EKG 02/11/2020: Sinus tachycardia at a rate of 114 bpm, left atrial enlargement.  Normal axis.  Incomplete right bundle branch block.  Poor R wave progression, cannot exclude anterior infarct old. Cannot exclude inferior infarct.  Abnormal EKG.  EKG 06/17/2019: Normal sinus rhythm at rate of 82 bpm, normal axis, cannot exclude inferior infarct old.  Incomplete right bundle branch block.  Poor R-wave progression, can't exclude anteroseptal infarct old.  No evidence of ischemia, normal QT interval.  Assessment  ICD-10-CM   1. Mixed hyperlipidemia  E78.2   2. Sinus tachycardia  R00.0 TSH    metoprolol succinate (TOPROL XL) 25 MG 24 hr tablet  3. Atypical chest pain  R07.89   4. Tobacco use  Z72.0      Medications Discontinued During This Encounter  Medication Reason  . Cholecalciferol 50 MCG (2000 UT) CAPS No longer needed (for PRN medications)    Meds ordered this encounter  Medications  . metoprolol succinate (TOPROL XL) 25 MG 24 hr tablet    Sig: Take 1 tablet (25 mg total) by mouth daily. Take with or immediately following a meal.    Dispense:  30 tablet    Refill:  3    Recommendations:   Isaac Dickson is a 44 y.o. male with history of tobacco dependence, hypertension, hyperlipidemia, allergic rhinitis, GERD, dysphagia, ADHD, anxiety and depression.  He was originally referred to our office on 06/17/2019 for atypical chest pain.  Patient's cardiovascular risk factors include hypertension, tobacco use, hyperlipidemia, diet, sedentary lifestyle.   Patient presents for 1 month follow-up of atypical chest pain,  hyperlipidemia, and cardiac test results.  Patient has had no recurrence of chest pain or shortness of breath since previous visit.  Suspect prior symptoms may have been due to questionable atelectasis noted on chest x-ray 02/07/2020.  Reviewed and discussed with patient results of echocardiogram in stress testing.  Reassured him that these were normal and did not reveal evidence of abnormal heart structure or function or ischemia.    At last visit ordered lab work to be done to investigate patient's hyperlipidemia as well as sinus tachycardia, however patient was unable to get these labs done in the meantime.  Encourage patient to get labs done as soon as possible.  Will obtain TSH, lipid panel to further investigate resting sinus tachycardia as his heart rate is again elevated today in the office.  CBC 02/07/2020 was within normal limits.  Patient does consume soda on a regular basis, encouraged him to reduce caffeine intake.    Blood pressure was initially elevated in the office today, however upon recheck it had improved to 118/86.  However patient does report having medical checks done frequently for work and they have noted his blood pressure to be greater than 947 systolic.  Suspect he may have a component of labile hypertension.  Will continue lisinopril 10 mg daily and add metoprolol succinate 25 mg daily as this will likely further improve blood pressure control as well as heart rate.  Could consider coronary calcium scoring in the future.   In regard to neck pain, recommend patient follow-up with PCP.  Again discussed tobacco cessation as well as importance of healthy diet and lifestyle.  Follow-up in 6 months for hyperlipidemia and sinus tachycardia, sooner if needed.   Alethia Berthold, PA-C 03/10/2020, 2:12 PM Office: [phone]

## 2020-03-10 ENCOUNTER — Ambulatory Visit: Payer: Managed Care, Other (non HMO) | Admitting: Student

## 2020-03-10 ENCOUNTER — Encounter: Payer: Self-pay | Admitting: Student

## 2020-03-10 ENCOUNTER — Other Ambulatory Visit: Payer: Self-pay

## 2020-03-10 VITALS — BP 118/86 | HR 121 | Resp 16 | Ht 72.0 in | Wt 232.0 lb

## 2020-03-10 DIAGNOSIS — R000 Tachycardia, unspecified: Secondary | ICD-10-CM

## 2020-03-10 DIAGNOSIS — R0789 Other chest pain: Secondary | ICD-10-CM

## 2020-03-10 DIAGNOSIS — E782 Mixed hyperlipidemia: Secondary | ICD-10-CM

## 2020-03-10 DIAGNOSIS — Z720 Tobacco use: Secondary | ICD-10-CM

## 2020-03-10 MED ORDER — METOPROLOL SUCCINATE ER 25 MG PO TB24: 25.0000 mg | ORAL_TABLET | Freq: Every day | ORAL | 3 refills | Status: DC

## 2020-04-27 ENCOUNTER — Other Ambulatory Visit: Payer: Self-pay | Admitting: Cardiology

## 2020-04-27 DIAGNOSIS — E782 Mixed hyperlipidemia: Secondary | ICD-10-CM

## 2020-08-02 NOTE — Progress Notes (Signed)
Primary Physician/Referring:  Shanon Rosser, PA-C  Patient ID: Isaac Dickson, male    DOB: Dec 26, 1975, 45 y.o.   MRN: [id number]  Chief Complaint  Patient presents with  . Shortness of Breath  . Follow-up   HPI:    Isaac Dickson  is a 45 y.o. male with history of tobacco dependence, hypertension, hyperlipidemia, allergic rhinitis, GERD, dysphagia, ADHD, anxiety and depression.  He was originally referred to our office on 06/17/2019 for atypical chest pain.   Patient presents for 6 month follow up hyperlipidemia and sinus tachycardia. At last visit added metoprolol succinate 25 mg daily for hypertension and tachycardia, however he never started this as he was seen by his PCP with GI infection a few days after his last office visit and following treatment for GI infection patient's blood pressure and heart rate improved.   Patient has been under significant stress lately as his wife was recently diagnosed with cancer last week and he has therefore taken over many responsibilities caring for their 2 young children.  He is concerned because over the last 2 weeks he has been experiencing episodes of shortness of breath primarily at rest lasting approximately 30-40 minutes and occurring daily.  Patient states most recent episode occurred while he was sleeping, he woke up feeling short of breath and anxious.  He does admit to diet noncompliance, reporting high salt and fat intake.  Unfortunately he was unable to obtain previously ordered labs before today's visit.  Patient also reports that during episodes when he feels short of breath he experiences chest pain when he takes deep breaths.  Fortunately patient continues to use tobacco on a daily basis, however he is expressing willingness to quit.  No personal history of diabetes, MI, CVA, TIA.  According to patient no history of major surgery.  History reviewed. No pertinent past medical history. History reviewed. No pertinent surgical  history. Family History  Problem Relation Age of Onset  . Hypertension Father   . Lung cancer Father     Social History   Tobacco Use  . Smoking status: Never Smoker  . Smokeless tobacco: Current User    Types: Chew  Substance Use Topics  . Alcohol use: Yes    Comment: occasonal   Marital Status: Married   ROS  Review of Systems  Cardiovascular: Positive for chest pain (pleuritic ). Negative for claudication, dyspnea on exertion, leg swelling, orthopnea, palpitations, paroxysmal nocturnal dyspnea and syncope.  Respiratory: Positive for shortness of breath.   Hematologic/Lymphatic: Does not bruise/bleed easily.  Musculoskeletal: Positive for neck pain.  Neurological: Negative for dizziness.    Objective  Blood pressure 128/88, pulse 97, temperature 98 F (36.7 C), temperature source Temporal, resp. rate 16, height 6' (1.829 m), weight 233 lb (105.7 kg), SpO2 96 %.  Vitals with BMI 08/03/2020 03/10/2020 02/11/2020  Height _0  _1  _2   Weight 233 lbs 232 lbs 232 lbs 5 oz  BMI 31.59 56.81 27.5  Systolic 170 017 494  Diastolic 88 86 82  Pulse 97 121 78    Physical Exam Vitals reviewed.  HENT:     Head: Normocephalic and atraumatic.  Cardiovascular:     Rate and Rhythm: Normal rate and regular rhythm.     Pulses: Intact distal pulses.     Heart sounds: S1 normal and S2 normal. No murmur heard. No gallop.      Comments: No leg edema.  Pulmonary:     Effort: Pulmonary effort is normal. No  respiratory distress.     Breath sounds: No wheezing, rhonchi or rales.  Musculoskeletal:     Right lower leg: No edema.     Left lower leg: No edema.  Skin:    General: Skin is warm and dry.  Neurological:     Mental Status: He is alert.  Psychiatric:     Comments: Patient appears anxious and stressed in view of recent life stressors      Laboratory examination:   No results for input(s): NA, K, CL, CO2, GLUCOSE, BUN, CREATININE, CALCIUM, GFRNONAA, GFRAA in the last 8760  hours. CrCl cannot be calculated (Patient's most recent lab result is older than the maximum 21 days allowed.).  CMP Latest Ref Rng & Units 03/24/2016 05/30/2015  Glucose 65 - 99 mg/dL 134(H) 135(H)  BUN 6 - 20 mg/dL 15 13  Creatinine 0.61 - 1.24 mg/dL 0.99 1.20  Sodium 135 - 145 mmol/L 141 139  Potassium 3.5 - 5.1 mmol/L 3.4(L) 4.2  Chloride 101 - 111 mmol/L 103 102  CO2 22 - 32 mmol/L 30 25  Calcium 8.9 - 10.3 mg/dL 9.4 9.0  Total Protein 6.5 - 8.1 g/dL 6.3(L) 6.1(L)  Total Bilirubin 0.3 - 1.2 mg/dL 0.8 1.0  Alkaline Phos 38 - 126 U/L 54 53  AST 15 - 41 U/L 22 35  ALT 17 - 63 U/L 25 57   CBC Latest Ref Rng & Units 03/24/2016 05/30/2015  WBC 4.0 - 10.5 K/uL 13.3(H) 7.5  Hemoglobin 13.0 - 17.0 g/dL 14.4 14.3  Hematocrit 39.0 - 52.0 % 43.6 42.4  Platelets 150 - 400 K/uL 159 223    Lipid Panel No results for input(s): CHOL, TRIG, LDLCALC, VLDL, HDL, CHOLHDL, LDLDIRECT in the last 8760 hours.  HEMOGLOBIN A1C No results found for: HGBA1C, MPG TSH No results for input(s): TSH in the last 8760 hours.  External labs:  02/07/2020: BUN 18, creatinine 1.11, GFR 82, CMP otherwise within normal limits Hemoglobin 14.9, hematocrit 44.3, CBC otherwise within normal limits D-dimer negative  05/20/2019: Glucose93, BUN/Cr16/1.15. QMVH84. Na/K139/4.5. Rest of the CMP normal WBC 4.8 H/H14.8/44.2. MCV85.5. Platelets210 Chol262, TG171, HDL59, [id number] TSH1.65normal  Medications and allergies  No Known Allergies   Outpatient Medications Prior to Visit  Medication Sig Dispense Refill  . amphetamine-dextroamphetamine (ADDERALL) 10 MG tablet Take 10 mg by mouth 2 (two) times daily with a meal.    . lisinopril (ZESTRIL) 10 MG tablet Take 1 tablet by mouth daily.    . rosuvastatin (CRESTOR) 20 MG tablet TAKE 1 TABLET(20 MG) BY MOUTH DAILY 90 tablet 1  . metoprolol succinate (TOPROL XL) 25 MG 24 hr tablet Take 1 tablet (25 mg total) by mouth daily. Take with or immediately following a  meal. 30 tablet 3   No facility-administered medications prior to visit.   Radiology:   No results found.   Chest x-ray 02/07/2020 (External): Heart appears normal in size.  Questionable minimal atelectasis versus infiltrate versus scarring right upper lobe. Recommend follow-up chest x-ray in 2 to 3 weeks.  Cardiac Studies:   PCV MYOCARDIAL PERFUSION WO LEXISCAN 02/23/2020 Exercise nuclear stress test was performed using Bruce protocol. Patient reached 10.6 METS, and 94% of age predicted maximum heart rate. Exercise capacity was good. 3/10 non-limiting chest pain reported. Heart rate and hemodynamic response were normal. Stress EKG revealed no ischemic changes. Normal myocardial perfusion. Stress LVEF 55%. Low risk study.  PCV ECHOCARDIOGRAM COMPLETE 69/62/9528 Normal LV systolic function with visual EF 55-60%. Left ventricle cavity is normal in size.  Normal global wall motion. Normal diastolic filling pattern, normal LAP. No significant valvular heart disease. No prior study for comparison.    EKG:   EKG 08/03/2020: Sinus rhythm at a rate of 92 bpm.  Normal axis.  Incomplete right bundle branch block.  Cannot exclude inferior infarct old.  EKG 02/11/2020, left atrial enlargement or PR WP noted, no other significant change.  EKG 02/11/2020: Sinus tachycardia at a rate of 114 bpm, left atrial enlargement.  Normal axis.  Incomplete right bundle branch block.  Poor R wave progression, cannot exclude anterior infarct old. Cannot exclude inferior infarct.  Abnormal EKG.  EKG 06/17/2019: Normal sinus rhythm at rate of 82 bpm, normal axis, cannot exclude inferior infarct old.  Incomplete right bundle branch block.  Poor R-wave progression, can't exclude anteroseptal infarct old.  No evidence of ischemia, normal QT interval.  Assessment     ICD-10-CM   1. Mixed hyperlipidemia  E78.2   2. Sinus tachycardia  R00.0   3. Tobacco use  Z72.0   4. SOB (shortness of breath)  R06.02 EKG 12-Lead     D-Dimer, Quantitative  5. Precordial pain  R07.2 High sensitivity CRP     Medications Discontinued During This Encounter  Medication Reason  . metoprolol succinate (TOPROL XL) 25 MG 24 hr tablet Error    Meds ordered this encounter  Medications  . nicotine (NICODERM CQ - DOSED IN MG/24 HOURS) 21 mg/24hr patch    Sig: Place 1 patch (21 mg total) onto the skin daily.    Dispense:  28 patch    Refill:  0  . nicotine (NICODERM CQ - DOSED IN MG/24 HOURS) 14 mg/24hr patch    Sig: Place 1 patch (14 mg total) onto the skin daily.    Dispense:  28 patch    Refill:  0    Recommendations:   VALE MOUSSEAU is a 45 y.o. male with history of tobacco dependence, hypertension, hyperlipidemia, allergic rhinitis, GERD, dysphagia, ADHD, anxiety and depression.  He was originally referred to our office on 06/17/2019 for atypical chest pain.  Patient's cardiovascular risk factors include hypertension, tobacco use, hyperlipidemia, diet, sedentary lifestyle.   Patient presents for 6 month follow up of sinus tachycardia and hyperlipidemia.  Patient's blood pressure is not well controlled, and his heart rate is improved.  However he presents with concerns of daily episodes of shortness of breath as well as pleuritic chest pain over the last 2 weeks.  Patient does admit some of these episodes appear to be associated with feelings of anxiety.  However he is concerned regarding cardiovascular risk factors.  In view of pleuritic chest pain as well as heart rate of 92 bpm will obtain D-dimer and CRP.  My suspicion for pulmonary embolism is low, however cannot be excluded in view of patient's symptoms.  Also cannot exclude underlying pericarditis.  We will also obtain coronary calcium score in order to further risk stratify patient.  He has had recent low risk stress test in 02/2020 as well as normal echocardiogram in 01/2020.  Suspect significant life stress in the context of his wife's recent cancer diagnosis may be  contributing to symptoms.  Patient's EKG today is without evidence of ischemia or underlying injury pattern.  Unfortunately patient continues to use tobacco on a daily basis, however he is extremely motivated to quit.  I will send for nicotine patches for tobacco cessation assistance.  Best at length regarding diet and lifestyle modifications in order to further reduce cardiovascular risk  factors, patient verbalized understanding.  Counseled patient regarding signs and symptoms that would warrant urgent or emergent evaluation, he verbalized understanding and agreement.  Follow-up in 4 weeks, sooner if needed, for shortness of breath and pleuritic chest pain.   Alethia Berthold, PA-C 08/04/2020, 11:03 AM Office: [phone]

## 2020-08-03 ENCOUNTER — Other Ambulatory Visit: Payer: Self-pay

## 2020-08-03 ENCOUNTER — Encounter: Payer: Self-pay | Admitting: Student

## 2020-08-03 ENCOUNTER — Ambulatory Visit: Payer: Managed Care, Other (non HMO) | Admitting: Student

## 2020-08-03 VITALS — BP 128/88 | HR 97 | Temp 98.0°F | Resp 16 | Ht 72.0 in | Wt 233.0 lb

## 2020-08-03 DIAGNOSIS — E782 Mixed hyperlipidemia: Secondary | ICD-10-CM

## 2020-08-03 DIAGNOSIS — R0602 Shortness of breath: Secondary | ICD-10-CM

## 2020-08-03 DIAGNOSIS — Z720 Tobacco use: Secondary | ICD-10-CM

## 2020-08-03 DIAGNOSIS — R000 Tachycardia, unspecified: Secondary | ICD-10-CM

## 2020-08-03 DIAGNOSIS — R072 Precordial pain: Secondary | ICD-10-CM

## 2020-08-03 MED ORDER — NICOTINE 21 MG/24HR TD PT24: 21.0000 mg | MEDICATED_PATCH | Freq: Every day | TRANSDERMAL | 0 refills | Status: AC

## 2020-08-03 MED ORDER — NICOTINE 14 MG/24HR TD PT24: 14.0000 mg | MEDICATED_PATCH | Freq: Every day | TRANSDERMAL | 0 refills | Status: DC

## 2020-08-15 ENCOUNTER — Other Ambulatory Visit: Payer: Self-pay | Admitting: Student

## 2020-08-15 DIAGNOSIS — Z8249 Family history of ischemic heart disease and other diseases of the circulatory system: Secondary | ICD-10-CM

## 2020-08-18 ENCOUNTER — Ambulatory Visit
Admission: RE | Admit: 2020-08-18 | Discharge: 2020-08-18 | Disposition: A | Discharge status: Home / Self Care | Payer: No Typology Code available for payment source | Source: Ambulatory Visit | Attending: Student | Admitting: Student

## 2020-08-18 DIAGNOSIS — Z8249 Family history of ischemic heart disease and other diseases of the circulatory system: Secondary | ICD-10-CM

## 2020-08-18 LAB — D-DIMER, QUANTITATIVE: D-DIMER: <0.2 mg/L FEU (ref 0.00–0.49)

## 2020-08-18 LAB — HIGH SENSITIVITY CRP: CRP, High Sensitivity: 0.28 mg/L (ref 0.00–3.00)

## 2020-08-18 NOTE — Progress Notes (Signed)
Please inform patient CRP and D-dimer are normal, no signs of pericarditis (inflammation of the sac around the heart) or pulmonary embolism (blood clot in the lungs)

## 2020-08-18 NOTE — Progress Notes (Signed)
Please inform patient coronary calcium score is 0, no evidence of calcium buildup in his heart arteries.

## 2020-08-22 NOTE — Progress Notes (Signed)
Patient is aware 

## 2020-09-07 NOTE — Progress Notes (Signed)
Primary Physician/Referring:  Shanon Rosser, PA-C  Patient ID: Isaac Dickson, male    DOB: Sep 10, 1975, 45 y.o.   MRN: [id number]  Chief Complaint  Patient presents with  . Tachycardia  . Hyperlipidemia  . Results    Calcium score   HPI:    Isaac Dickson  is a 45 y.o. male with history of tobacco dependence, hypertension, hyperlipidemia, allergic rhinitis, GERD, dysphagia, ADHD, anxiety and depression.  He was originally referred to our office on 06/17/2019 for atypical chest pain.   Patient presents for 4-week follow-up of shortness of breath and pleuritic chest pain.  CRP and D-dimer were normal and coronary calcium score is 0. Since last visit patient's wife has continued to follow with oncology and was told she has approximately 6 month life expectancy.  This understandably is a significant source of stress and anxiety for patient.  He reports his symptoms have improved mildly since last visit.  However he continues to have episodes approximately twice per day of palpitations and shortness of breath, these episodes are always associated with feelings of anxiety and being "overwhelmed".  Patient has reduced tobacco use, however unfortunately he does continue to use it.  He is presently using nicotine patches to help him quit.   Past Medical History:  Diagnosis Date  . Hyperlipidemia   . Hypertension    History reviewed. No pertinent surgical history. Family History  Problem Relation Age of Onset  . Hypertension Father   . Lung cancer Father     Social History   Tobacco Use  . Smoking status: Never Smoker  . Smokeless tobacco: Current User    Types: Chew  Substance Use Topics  . Alcohol use: Yes    Comment: occasonal   Marital Status: Married   ROS  Review of Systems  Cardiovascular: Positive for chest pain (pleuritic ) and palpitations. Negative for claudication, dyspnea on exertion, leg swelling, orthopnea, paroxysmal nocturnal dyspnea and syncope.  Respiratory:  Positive for shortness of breath.   Hematologic/Lymphatic: Does not bruise/bleed easily.  Musculoskeletal: Positive for neck pain.  Neurological: Negative for dizziness.    Objective  Blood pressure 123/86, pulse 93, temperature 98.6 F (37 C), resp. rate 17, height 6' (1.829 m), weight 231 lb (104.8 kg), SpO2 97 %.  Vitals with BMI 09/08/2020 08/03/2020 03/10/2020  Height _0  _1  _2   Weight 231 lbs 233 lbs 232 lbs  BMI 31.32 80.32 12.24  Systolic 825 003 704  Diastolic 86 88 86  Pulse 93 97 121    Physical Exam Vitals reviewed.  HENT:     Head: Normocephalic and atraumatic.  Cardiovascular:     Rate and Rhythm: Normal rate and regular rhythm.     Pulses: Intact distal pulses.     Heart sounds: S1 normal and S2 normal. No murmur heard. No gallop.      Comments: No leg edema.  Pulmonary:     Effort: Pulmonary effort is normal. No respiratory distress.     Breath sounds: No wheezing, rhonchi or rales.  Musculoskeletal:     Right lower leg: No edema.     Left lower leg: No edema.  Skin:    General: Skin is warm and dry.  Neurological:     General: No focal deficit present.     Mental Status: He is alert and oriented to person, place, and time.  Psychiatric:     Comments: Patient appears anxious and stressed in view of recent life stressors  Laboratory examination:   No results for input(s): NA, K, CL, CO2, GLUCOSE, BUN, CREATININE, CALCIUM, GFRNONAA, GFRAA in the last 8760 hours. CrCl cannot be calculated (Patient's most recent lab result is older than the maximum 21 days allowed.).  CMP Latest Ref Rng & Units 03/24/2016 05/30/2015  Glucose 65 - 99 mg/dL 134(H) 135(H)  BUN 6 - 20 mg/dL 15 13  Creatinine 0.61 - 1.24 mg/dL 0.99 1.20  Sodium 135 - 145 mmol/L 141 139  Potassium 3.5 - 5.1 mmol/L 3.4(L) 4.2  Chloride 101 - 111 mmol/L 103 102  CO2 22 - 32 mmol/L 30 25  Calcium 8.9 - 10.3 mg/dL 9.4 9.0  Total Protein 6.5 - 8.1 g/dL 6.3(L) 6.1(L)  Total Bilirubin  0.3 - 1.2 mg/dL 0.8 1.0  Alkaline Phos 38 - 126 U/L 54 53  AST 15 - 41 U/L 22 35  ALT 17 - 63 U/L 25 57   CBC Latest Ref Rng & Units 03/24/2016 05/30/2015  WBC 4.0 - 10.5 K/uL 13.3(H) 7.5  Hemoglobin 13.0 - 17.0 g/dL 14.4 14.3  Hematocrit 39.0 - 52.0 % 43.6 42.4  Platelets 150 - 400 K/uL 159 223    Lipid Panel No results for input(s): CHOL, TRIG, LDLCALC, VLDL, HDL, CHOLHDL, LDLDIRECT in the last 8760 hours.  HEMOGLOBIN A1C No results found for: HGBA1C, MPG TSH No results for input(s): TSH in the last 8760 hours.  External labs:  02/07/2020: BUN 18, creatinine 1.11, GFR 82, CMP otherwise within normal limits Hemoglobin 14.9, hematocrit 44.3, CBC otherwise within normal limits D-dimer negative  05/20/2019: Glucose93, BUN/Cr16/1.15. UJWJ19. Na/K139/4.5. Rest of the CMP normal WBC 4.8 H/H14.8/44.2. MCV85.5. Platelets210 Chol262, TG171, HDL59, [id number] TSH1.65normal  Medications and allergies  No Known Allergies   Outpatient Medications Prior to Visit  Medication Sig Dispense Refill  . amphetamine-dextroamphetamine (ADDERALL) 10 MG tablet Take 10 mg by mouth 2 (two) times daily with a meal.    . lisinopril (ZESTRIL) 5 MG tablet Take 1 tablet by mouth daily.    . nicotine (NICODERM CQ - DOSED IN MG/24 HOURS) 14 mg/24hr patch Place 1 patch (14 mg total) onto the skin daily. 28 patch 0  . nicotine (NICODERM CQ - DOSED IN MG/24 HOURS) 21 mg/24hr patch Place 1 patch (21 mg total) onto the skin daily. 28 patch 0  . rosuvastatin (CRESTOR) 20 MG tablet TAKE 1 TABLET(20 MG) BY MOUTH DAILY 90 tablet 1   No facility-administered medications prior to visit.   Radiology:   No results found.   Chest x-ray 02/07/2020 (External): Heart appears normal in size.  Questionable minimal atelectasis versus infiltrate versus scarring right upper lobe. Recommend follow-up chest x-ray in 2 to 3 weeks.  Cardiac Studies:   Coronary calcium score 08/18/2020: Left Main: 0 LAD: 0 LCx:  0 RCA: 0 Total Agatston Score: 0 MESA database percentile: Not applicable due to age (< 2).  PCV MYOCARDIAL PERFUSION WO LEXISCAN 02/23/2020 Exercise nuclear stress test was performed using Bruce protocol. Patient reached 10.6 METS, and 94% of age predicted maximum heart rate. Exercise capacity was good. 3/10 non-limiting chest pain reported. Heart rate and hemodynamic response were normal. Stress EKG revealed no ischemic changes. Normal myocardial perfusion. Stress LVEF 55%. Low risk study.  PC[facility] ECHOCARDIOGRAM COMPLETE 14/78/2956 Normal LV systolic function with visual EF 55-60%. Left ventricle cavity is normal in size. Normal global wall motion. Normal diastolic filling pattern, normal LAP. No significant valvular heart disease. No prior study for comparison.    EKG:   EKG 08/03/2020:  Sinus rhythm at a rate of 92 bpm.  Normal axis.  Incomplete right bundle branch block.  Cannot exclude inferior infarct old.  EKG 02/11/2020, left atrial enlargement or PR WP noted, no other significant change.  EKG 02/11/2020: Sinus tachycardia at a rate of 114 bpm, left atrial enlargement.  Normal axis.  Incomplete right bundle branch block.  Poor R wave progression, cannot exclude anterior infarct old. Cannot exclude inferior infarct.  Abnormal EKG.  EKG 06/17/2019: Normal sinus rhythm at rate of 82 bpm, normal axis, cannot exclude inferior infarct old.  Incomplete right bundle branch block.  Poor R-wave progression, can't exclude anteroseptal infarct old.  No evidence of ischemia, normal QT interval.  Assessment     ICD-10-CM   1. Mixed hyperlipidemia  E78.2   2. Tobacco use  Z72.0   3. SOB (shortness of breath)  R06.02   4. Precordial pain  R07.2      There are no discontinued medications.  Meds ordered this encounter  Medications  . metoprolol tartrate (LOPRESSOR) 25 MG tablet    Sig: Take 1 tablet (25 mg total) by mouth 2 (two) times daily. With additional doses as needed, up to an  additional 50 mg per day    Dispense:  60 tablet    Refill:  3    Recommendations:   Isaac Dickson is a 45 y.o. male with history of tobacco dependence, hypertension, hyperlipidemia, allergic rhinitis, GERD, dysphagia, ADHD, anxiety and depression.  He was originally referred to our office on 06/17/2019 for atypical chest pain.  Patient's cardiovascular risk factors include hypertension, tobacco use, hyperlipidemia, diet, sedentary lifestyle.   Patient presents for 4-week follow-up of shortness of breath and pleuritic chest pain.  CRP and D-dimer were normal and coronary calcium score is 0.  Patient reports his symptoms have mildly improved, however he continues to have episodes daily of palpitations and shortness of breath associated with feelings of anxiety when he thinks about his wife.  Reviewed and discussed with patient regarding coronary calcium score, details above.  I have reassured him there is no evidence of cardiovascular work-up of underlying cardiac etiology of chest discomfort.  Suspect patient's anxiety triggers palpitations which are associated with shortness of breath and chest discomfort.  Shared decision was therefore to start patient on metoprolol titrate 25 mg twice daily with additional doses as needed for palpitations.  Discussed with patient amatory cardiac telemetry is not indicated at this time as suspicion for significant cardiac arrhythmias is low.  However if symptoms fail to improve or persist, could consider cardiac monitor in the future.  Encourage patient to continue to focus on diet and lifestyle modifications as well as discontinuation of tobacco use. I have also encouraged him to follow up with PCP for further evaluation and treatment of anxiety.   Follow-up in 1 year, sooner if needed, for palpitations.   Alethia Berthold, PA-C 09/08/2020, 1:45 PM Office: [phone]

## 2020-09-08 ENCOUNTER — Encounter: Payer: Self-pay | Admitting: Student

## 2020-09-08 ENCOUNTER — Ambulatory Visit: Payer: Managed Care, Other (non HMO) | Admitting: Student

## 2020-09-08 ENCOUNTER — Other Ambulatory Visit: Payer: Self-pay

## 2020-09-08 VITALS — BP 123/86 | HR 93 | Temp 98.6°F | Resp 17 | Ht 72.0 in | Wt 231.0 lb

## 2020-09-08 DIAGNOSIS — R072 Precordial pain: Secondary | ICD-10-CM

## 2020-09-08 DIAGNOSIS — E782 Mixed hyperlipidemia: Secondary | ICD-10-CM

## 2020-09-08 DIAGNOSIS — Z720 Tobacco use: Secondary | ICD-10-CM

## 2020-09-08 DIAGNOSIS — R0602 Shortness of breath: Secondary | ICD-10-CM

## 2020-09-08 MED ORDER — METOPROLOL TARTRATE 25 MG PO TABS: 25.0000 mg | ORAL_TABLET | Freq: Two times a day (BID) | ORAL | 3 refills | Status: DC

## 2021-04-10 ENCOUNTER — Emergency Department (HOSPITAL_BASED_OUTPATIENT_CLINIC_OR_DEPARTMENT_OTHER): Payer: Managed Care, Other (non HMO) | Admitting: Radiology

## 2021-04-10 ENCOUNTER — Other Ambulatory Visit: Payer: Self-pay

## 2021-04-10 ENCOUNTER — Encounter (HOSPITAL_BASED_OUTPATIENT_CLINIC_OR_DEPARTMENT_OTHER): Payer: Self-pay | Admitting: Emergency Medicine

## 2021-04-10 ENCOUNTER — Emergency Department (HOSPITAL_BASED_OUTPATIENT_CLINIC_OR_DEPARTMENT_OTHER)
Admission: EM | Admit: 2021-04-10 | Discharge: 2021-04-10 | Disposition: A | Discharge status: Home / Self Care | Payer: Managed Care, Other (non HMO) | Attending: Emergency Medicine | Admitting: Emergency Medicine

## 2021-04-10 DIAGNOSIS — Z87891 Personal history of nicotine dependence: Secondary | ICD-10-CM | POA: Insufficient documentation

## 2021-04-10 DIAGNOSIS — R202 Paresthesia of skin: Secondary | ICD-10-CM | POA: Insufficient documentation

## 2021-04-10 DIAGNOSIS — R0789 Other chest pain: Secondary | ICD-10-CM | POA: Insufficient documentation

## 2021-04-10 DIAGNOSIS — I10 Essential (primary) hypertension: Secondary | ICD-10-CM | POA: Diagnosis not present

## 2021-04-10 DIAGNOSIS — R079 Chest pain, unspecified: Secondary | ICD-10-CM | POA: Diagnosis present

## 2021-04-10 DIAGNOSIS — Z85118 Personal history of other malignant neoplasm of bronchus and lung: Secondary | ICD-10-CM | POA: Diagnosis not present

## 2021-04-10 DIAGNOSIS — Z79899 Other long term (current) drug therapy: Secondary | ICD-10-CM | POA: Insufficient documentation

## 2021-04-10 LAB — BASIC METABOLIC PANEL
Anion gap: 7 (ref 5–15)
BUN: 20 mg/dL (ref 6–20)
CO2: 29 mmol/L (ref 22–32)
Calcium: 9.1 mg/dL (ref 8.9–10.3)
Chloride: 103 mmol/L (ref 98–111)
Creatinine, Ser: 1.19 mg/dL (ref 0.61–1.24)
GFR, Estimated: >60 mL/min (ref >60)
Glucose, Bld: 122 mg/dL — ABNORMAL HIGH (ref 70–99)
Potassium: 4 mmol/L (ref 3.5–5.1)
Sodium: 139 mmol/L (ref 135–145)

## 2021-04-10 LAB — CBC WITH DIFFERENTIAL/PLATELET
Abs Immature Granulocytes: 0.03 10*3/uL (ref 0.00–0.07)
Basophils Absolute: 0.1 10*3/uL (ref 0.0–0.1)
Basophils Relative: 1 %
Eosinophils Absolute: 0.5 10*3/uL (ref 0.0–0.5)
Eosinophils Relative: 7 %
HCT: 41.8 % (ref 39.0–52.0)
Hemoglobin: 13.7 g/dL (ref 13.0–17.0)
Immature Granulocytes: 0 %
Lymphocytes Relative: 32 %
Lymphs Abs: 2.3 10*3/uL (ref 0.7–4.0)
MCH: 28 pg (ref 26.0–34.0)
MCHC: 32.8 g/dL (ref 30.0–36.0)
MCV: 85.3 fL (ref 80.0–100.0)
Monocytes Absolute: 0.7 10*3/uL (ref 0.1–1.0)
Monocytes Relative: 10 %
Neutro Abs: 3.7 10*3/uL (ref 1.7–7.7)
Neutrophils Relative %: 50 %
Platelets: 197 10*3/uL (ref 150–400)
RBC: 4.9 MIL/uL (ref 4.22–5.81)
RDW: 12 % (ref 11.5–15.5)
WBC: 7.3 10*3/uL (ref 4.0–10.5)
nRBC: 0 % (ref 0.0–0.2)

## 2021-04-10 LAB — D-DIMER, QUANTITATIVE: D-Dimer, Quant: <0.27 ug/mL-FEU (ref 0.00–0.50)

## 2021-04-10 LAB — TROPONIN I (HIGH SENSITIVITY)
Troponin I (High Sensitivity): 2 ng/L (ref <18)
Troponin I (High Sensitivity): 3 ng/L (ref <18)

## 2021-04-10 MED ORDER — PANTOPRAZOLE SODIUM 40 MG IV SOLR
40.0000 mg | Freq: Once | INTRAVENOUS | Status: AC
  Administered 2021-04-10: 40 mg via INTRAVENOUS
  Filled 2021-04-10: qty 40

## 2021-04-10 NOTE — ED Provider Notes (Signed)
[facility] Provider Note: Lowella Dell, MD, FACEP  CSN: [id number] MRN: [id number] ARRIVAL: 04/10/21 at 0038 ROOM: DB009/DB009   CHIEF COMPLAINT  Chest Pain   HISTORY OF PRESENT ILLNESS  04/10/21 1:26 AM Isaac Dickson is a 45 y.o. male who has had chest pain since yesterday afternoon.  The pain is located in the 3 places, 2 in the left upper chest and 1 in the left axilla.  The pain is described as sharp.  The pain has been coming and going.  When the pain is present he rates it as about a 6 out of 10.  It is worse with deep breathing and with certain movements.  He is equivocal about there being any shortness of breath with it.  He has not had nausea or diaphoresis.  He states his neck feels stiff after driving from [place] earlier today and for the last hour the toes of his left foot have been tingling.  He was widowed in July of this year and feels increased stress.  He had a negative stress test and normal echocardiogram last year.   Past Medical History:  Diagnosis Date   Hyperlipidemia    Hypertension     No past surgical history on file.  Family History  Problem Relation Age of Onset   Hypertension Father    Lung cancer Father     Social History   Tobacco Use   Smoking status: Former    Types: Cigarettes   Smokeless tobacco: Current    Types: Chew    Last attempt to quit: 02/15/2019  Vaping Use   Vaping Use: Never used  Substance Use Topics   Alcohol use: Yes    Comment: occasonal   Drug use: No    Prior to Admission medications   Medication Sig Start Date End Date Taking? Authorizing Provider  amphetamine-dextroamphetamine (ADDERALL) 10 MG tablet Take 10 mg by mouth 2 (two) times daily with a meal.    [provider]  lisinopril (ZESTRIL) 5 MG tablet Take 1 tablet by mouth daily.    [provider]  metoprolol tartrate (LOPRESSOR) 25 MG tablet Take 1 tablet (25 mg total) by mouth 2 (two) times daily. With additional doses as  needed, up to an additional 50 mg per day 09/08/20 01/06/21  Cantwell, Park Meo C, PA-C  nicotine (NICODERM CQ - DOSED IN MG/24 HOURS) 14 mg/24hr patch Place 1 patch (14 mg total) onto the skin daily. 08/03/20   Cantwell, Celeste C, PA-C  nicotine (NICODERM CQ - DOSED IN MG/24 HOURS) 21 mg/24hr patch Place 1 patch (21 mg total) onto the skin daily. 08/03/20   Cantwell, Celeste C, PA-C  rosuvastatin (CRESTOR) 20 MG tablet TAKE 1 TABLET(20 MG) BY MOUTH DAILY 04/27/20   Cantwell, Celeste C, PA-C    Allergies Patient has no known allergies.   REVIEW OF SYSTEMS  Negative except as noted here or in the History of Present Illness.   PHYSICAL EXAMINATION  Initial Vital Signs Blood pressure (!) 141/102, pulse 80, temperature 98.2 F (36.8 C), temperature source Oral, resp. rate 20, height 6' (1.829 m), weight 104.3 kg, SpO2 99 %.  Examination General: Well-developed, well-nourished male in no acute distress; appearance consistent with age of record HENT: normocephalic; atraumatic Eyes: Normal appearance Neck: supple Heart: regular rate and rhythm; no murmur Lungs: clear to auscultation bilaterally Chest: Nontender Abdomen: soft; nondistended; nontender; bowel sounds present Extremities: No deformity; full range of motion Neurologic: Awake, alert and oriented; motor function intact  in all extremities and symmetric; no facial droop Skin: Warm and dry Psychiatric: Flat affect   RESULTS  Summary of this visit's results, reviewed and interpreted by myself:   EKG Interpretation  Date/Time:  Tuesday April 10 2021 00:44:38 EST Ventricular Rate:  76 PR Interval:  150 QRS Duration: 90 QT Interval:  366 QTC Calculation: 411 R Axis:   3 Text Interpretation: Normal sinus rhythm Normal ECG No significant change was found Confirmed by Paula Libra ([id number]) on 04/10/2021 12:47:16 AM       Laboratory Studies: Results for orders placed or performed during the hospital encounter of 04/10/21 (from  the past 24 hour(s))  Basic metabolic panel     Status: Abnormal   Collection Time: 04/10/21 12:57 AM  Result Value Ref Range   Sodium 139 135 - 145 mmol/L   Potassium 4.0 3.5 - 5.1 mmol/L   Chloride 103 98 - 111 mmol/L   CO2 29 22 - 32 mmol/L   Glucose, Bld 122 (H) 70 - 99 mg/dL   BUN 20 6 - 20 mg/dL   Creatinine, Ser 1.74 0.61 - 1.24 mg/dL   Calcium 9.1 8.9 - 08.1 mg/dL   GFR, Estimated >44 >81 mL/min   Anion gap 7 5 - 15  Troponin I (High Sensitivity)     Status: None   Collection Time: 04/10/21 12:57 AM  Result Value Ref Range   Troponin I (High Sensitivity) 2 <18 ng/L  CBC with Differential/Platelet     Status: None   Collection Time: 04/10/21 12:57 AM  Result Value Ref Range   WBC 7.3 4.0 - 10.5 K/uL   RBC 4.90 4.22 - 5.81 MIL/uL   Hemoglobin 13.7 13.0 - 17.0 g/dL   HCT 85.6 31.4 - 97.0 %   MCV 85.3 80.0 - 100.0 fL   MCH 28.0 26.0 - 34.0 pg   MCHC 32.8 30.0 - 36.0 g/dL   RDW 26.3 78.5 - 88.5 %   Platelets 197 150 - 400 K/uL   nRBC 0.0 0.0 - 0.2 %   Neutrophils Relative % 50 %   Neutro Abs 3.7 1.7 - 7.7 K/uL   Lymphocytes Relative 32 %   Lymphs Abs 2.3 0.7 - 4.0 K/uL   Monocytes Relative 10 %   Monocytes Absolute 0.7 0.1 - 1.0 K/uL   Eosinophils Relative 7 %   Eosinophils Absolute 0.5 0.0 - 0.5 K/uL   Basophils Relative 1 %   Basophils Absolute 0.1 0.0 - 0.1 K/uL   Immature Granulocytes 0 %   Abs Immature Granulocytes 0.03 0.00 - 0.07 K/uL  D-dimer, quantitative     Status: None   Collection Time: 04/10/21  1:49 AM  Result Value Ref Range   D-Dimer, Quant <0.27 0.00 - 0.50 ug/mL-FEU  Troponin I (High Sensitivity)     Status: None   Collection Time: 04/10/21  2:54 AM  Result Value Ref Range   Troponin I (High Sensitivity) 3 <18 ng/L   Imaging Studies: DG Chest 2 View  Result Date: 04/10/2021 CLINICAL DATA:  Left-sided chest pain EXAM: CHEST - 2 VIEW COMPARISON:  05/30/2015 FINDINGS: Cardiac and mediastinal contours are within normal limits. No focal  pulmonary opacity. No pleural effusion or pneumothorax. No acute osseous abnormality. IMPRESSION: No acute cardiopulmonary process. Electronically Signed   By: Wiliam Ke M.D.   On: 04/10/2021 01:09    ED COURSE and MDM  Nursing notes, initial and subsequent vitals signs, including pulse oximetry, reviewed and interpreted by myself.  Vitals:  04/10/21 0051 04/10/21 0200 04/10/21 0230 04/10/21 0330  BP:  118/87 126/90 99/73  Pulse:  82 78 68  Resp:  16 16 15   Temp:      TempSrc:      SpO2:  100% 99% 98%  Weight: 104.3 kg     Height: 6' (1.829 m)      Medications  pantoprazole (PROTONIX) injection 40 mg (40 mg Intravenous Given 04/10/21 0310)   4:04 AM Patient's chest discomfort has resolved as has his paresthesias.  The pain was pleuritic in nature and atypical for cardiac presentation.  D-dimer is normal and he has no risk factors for thromboembolic disease.  He does acknowledge having "acid reflux" recently.    PROCEDURES  Procedures   ED DIAGNOSES     ICD-10-CM   1. Atypical chest pain  R07.89     2. Paresthesias  R20.2          04/12/21, MD 04/10/21 [phone]

## 2021-04-10 NOTE — ED Triage Notes (Signed)
Pt c/o of ongoing left sided chest pain throughout day today. Worsening this afternoon. States HX CP, seen and cleared by cardiology last year. Endorses increased stressed. Rates CP 6/10, described as pressure, associated with left foot numbness and neck stiffness, denies any other symptoms.

## 2021-05-22 ENCOUNTER — Other Ambulatory Visit: Payer: Self-pay | Admitting: Student

## 2021-09-10 ENCOUNTER — Ambulatory Visit: Payer: Managed Care, Other (non HMO) | Admitting: Student

## 2021-09-10 NOTE — Progress Notes (Deleted)
Primary Physician/Referring:  Shanon Rosser, PA-C  Patient ID: Isaac Dickson, male    DOB: [birth year], 46 y.o.   MRN: [id number]  No chief complaint on file.  HPI:    Isaac Dickson  is a 46 y.o. male with history of tobacco dependence, hypertension, hyperlipidemia, allergic rhinitis, GERD, dysphagia, ADHD, anxiety and depression.  He was originally referred to our office on 06/17/2019 for atypical chest pain.  Coronary calcium score in 08/2020 is 0  Patient presents for 1 year follow-up.  At last office visit started patient on Lopressor 25 mg p.o. daily for palpitations. ***  ***Wife was given 6 months to live at last office visit. Needs labs - lipids  Patient presents for 4-week follow-up of shortness of breath and pleuritic chest pain.  CRP and D-dimer were normal and coronary calcium score is 0. Since last visit patient's wife has continued to follow with oncology and was told she has approximately 6 month life expectancy.  This understandably is a significant source of stress and anxiety for patient.  He reports his symptoms have improved mildly since last visit.  However he continues to have episodes approximately twice per day of palpitations and shortness of breath, these episodes are always associated with feelings of anxiety and being "overwhelmed".  Patient has reduced tobacco use, however unfortunately he does continue to use it.  He is presently using nicotine patches to help him quit.   Past Medical History:  Diagnosis Date   Hyperlipidemia    Hypertension    No past surgical history on file. Family History  Problem Relation Age of Onset   Hypertension Father    Lung cancer Father     Social History   Tobacco Use   Smoking status: Former    Types: Cigarettes   Smokeless tobacco: Current    Types: Chew    Last attempt to quit: 02/15/2019  Substance Use Topics   Alcohol use: Yes    Comment: occasonal   Marital Status: Married   ROS  Review of Systems   Cardiovascular:  Positive for chest pain (pleuritic ) and palpitations. Negative for claudication, dyspnea on exertion, leg swelling, orthopnea, paroxysmal nocturnal dyspnea and syncope.  Respiratory:  Positive for shortness of breath.   Hematologic/Lymphatic: Does not bruise/bleed easily.  Musculoskeletal:  Positive for neck pain.  Neurological:  Negative for dizziness.   Objective  There were no vitals taken for this visit.     04/10/2021    3:30 AM 04/10/2021    2:30 AM 04/10/2021    2:00 AM  Vitals with BMI  Systolic 99 893 734  Diastolic 73 90 87  Pulse 68 78 82    Physical Exam Vitals reviewed.  HENT:     Head: Normocephalic and atraumatic.  Cardiovascular:     Rate and Rhythm: Normal rate and regular rhythm.     Pulses: Intact distal pulses.     Heart sounds: S1 normal and S2 normal. No murmur heard.   No gallop.     Comments: No leg edema.  Pulmonary:     Effort: Pulmonary effort is normal. No respiratory distress.     Breath sounds: No wheezing, rhonchi or rales.  Musculoskeletal:     Right lower leg: No edema.     Left lower leg: No edema.  Skin:    General: Skin is warm and dry.  Neurological:     General: No focal deficit present.     Mental Status: He is alert  and oriented to person, place, and time.  Psychiatric:     Comments: Patient appears anxious and stressed in view of recent life stressors     Laboratory examination:   Recent Labs    04/10/21 0057  NA 139  K 4.0  CL 103  CO2 29  GLUCOSE 122*  BUN 20  CREATININE 1.19  CALCIUM 9.1  GFRNONAA >60   CrCl cannot be calculated (Patient's most recent lab result is older than the maximum 21 days allowed.).     Latest Ref Rng & Units 04/10/2021   12:57 AM 03/24/2016    9:55 PM 05/30/2015    1:05 AM  CMP  Glucose 70 - 99 mg/dL 122   134   135    BUN 6 - 20 mg/dL _0 Creatinine 0.61 - 1.24 mg/dL 1.19   0.99   1.20    Sodium 135 - 145 mmol/L 139   141   139    Potassium 3.5 - 5.1  mmol/L 4.0   3.4   4.2    Chloride 98 - 111 mmol/L 103   103   102    CO2 22 - 32 mmol/L _1 Calcium 8.9 - 10.3 mg/dL 9.1   9.4   9.0    Total Protein 6.5 - 8.1 g/dL  6.3   6.1    Total Bilirubin 0.3 - 1.2 mg/dL  0.8   1.0    Alkaline Phos 38 - 126 U/L  54   53    AST 15 - 41 U/L  22   35    ALT 17 - 63 U/L  25   57        Latest Ref Rng & Units 04/10/2021   12:57 AM 03/24/2016    9:55 PM 05/30/2015    1:05 AM  CBC  WBC 4.0 - 10.5 K/uL 7.3   13.3   7.5    Hemoglobin 13.0 - 17.0 g/dL 13.7   14.4   14.3    Hematocrit 39.0 - 52.0 % 41.8   43.6   42.4    Platelets 150 - 400 K/uL 197   159   223      Lipid Panel No results for input(s): CHOL, TRIG, LDLCALC, VLDL, HDL, CHOLHDL, LDLDIRECT in the last 8760 hours.  HEMOGLOBIN A1C No results found for: HGBA1C, MPG TSH No results for input(s): TSH in the last 8760 hours.  External labs:  02/07/2020: BUN 18, creatinine 1.11, GFR 82, CMP otherwise within normal limits Hemoglobin 14.9, hematocrit 44.3, CBC otherwise within normal limits D-dimer negative  05/20/2019: Glucose 93, BUN/Cr 16/1.15. EGFR 79. Na/K 139/4.5. Rest of the CMP normal WBC 4.8 H/H 14.8/44.2. MCV 85.5. Platelets 210 Chol 262, TG 171, HDL 59, LDL 171 TSH 1.65 normal  Allergies  No Known Allergies   Medications Prior to Visit:   Outpatient Medications Prior to Visit  Medication Sig Dispense Refill   amphetamine-dextroamphetamine (ADDERALL) 10 MG tablet Take 10 mg by mouth 2 (two) times daily with a meal.     lisinopril (ZESTRIL) 5 MG tablet Take 1 tablet by mouth daily.     metoprolol tartrate (LOPRESSOR) 25 MG tablet TAKE 1 TABLET BY MOUTH TWICE DAILY WITH ADDITIONAL DOSES AS NEEDED, UP TO AN ADDITIONAL 50MG PER DAY 60 tablet 3   nicotine (NICODERM CQ - DOSED IN MG/24 HOURS) 14 mg/24hr patch Place 1 patch (  14 mg total) onto the skin daily. 28 patch 0   nicotine (NICODERM CQ - DOSED IN MG/24 HOURS) 21 mg/24hr patch Place 1 patch (21 mg total) onto the  skin daily. 28 patch 0   rosuvastatin (CRESTOR) 20 MG tablet TAKE 1 TABLET(20 MG) BY MOUTH DAILY 90 tablet 1   No facility-administered medications prior to visit.   Final Medications at End of Visit    No outpatient medications have been marked as taking for the 09/10/21 encounter (Appointment) with Rayetta Pigg, Celeste C, PA-C.   Radiology:   No results found.   Chest x-ray 02/07/2020 (External): Heart appears normal in size.  Questionable minimal atelectasis versus infiltrate versus scarring right upper lobe. Recommend follow-up chest x-ray in 2 to 3 weeks.  Cardiac Studies:   Coronary calcium score 08/18/2020: Left Main: 0 LAD: 0 LCx: 0 RCA: 0 Total Agatston Score: 0 MESA database percentile: Not applicable due to age (< 72).  PCV MYOCARDIAL PERFUSION WO LEXISCAN 02/23/2020 Exercise nuclear stress test was performed using Bruce protocol. Patient reached 10.6 METS, and 94% of age predicted maximum heart rate. Exercise capacity was good. 3/10 non-limiting chest pain reported. Heart rate and hemodynamic response were normal. Stress EKG revealed no ischemic changes. Normal myocardial perfusion. Stress LVEF 55%. Low risk study.  PCV ECHOCARDIOGRAM COMPLETE 38/75/6433 Normal LV systolic function with visual EF 55-60%. Left ventricle cavity is normal in size. Normal global wall motion. Normal diastolic filling pattern, normal LAP. No significant valvular heart disease. No prior study for comparison.   EKG:  ***   EKG 08/03/2020: Sinus rhythm at a rate of 92 bpm.  Normal axis.  Incomplete right bundle branch block.  Cannot exclude inferior infarct old.  EKG 02/11/2020, left atrial enlargement or PR WP noted, no other significant change.  EKG 02/11/2020: Sinus tachycardia at a rate of 114 bpm, left atrial enlargement.  Normal axis.  Incomplete right bundle branch block.  Poor R wave progression, cannot exclude anterior infarct old. Cannot exclude inferior infarct.  Abnormal EKG.  EKG  06/17/2019: Normal sinus rhythm at rate of 82 bpm, normal axis, cannot exclude inferior infarct old.  Incomplete right bundle branch block.  Poor R-wave progression, can't exclude anteroseptal infarct old.  No evidence of ischemia, normal QT interval.  Assessment   No diagnosis found.    There are no discontinued medications.  No orders of the defined types were placed in this encounter.   Recommendations:   KEAGHAN STATON is a 46 y.o. male with history of tobacco dependence, hypertension, hyperlipidemia, allergic rhinitis, GERD, dysphagia, ADHD, anxiety and depression.  He was originally referred to our office on 06/17/2019 for atypical chest pain.  Patient's cardiovascular risk factors include hypertension, tobacco use, hyperlipidemia, diet, sedentary lifestyle.   Patient presents for 1 year follow-up.  At last office visit started patient on Lopressor 25 mg p.o. daily for palpitations. ***  ***Wife was given 6 months to live at last office visit.   Patient presents for 4-week follow-up of shortness of breath and pleuritic chest pain.  CRP and D-dimer were normal and coronary calcium score is 0.  Patient reports his symptoms have mildly improved, however he continues to have episodes daily of palpitations and shortness of breath associated with feelings of anxiety when he thinks about his wife.  Reviewed and discussed with patient regarding coronary calcium score, details above.  I have reassured him there is no evidence of cardiovascular work-up of underlying cardiac etiology of chest discomfort.  Suspect patient's  anxiety triggers palpitations which are associated with shortness of breath and chest discomfort.  Shared decision was therefore to start patient on metoprolol titrate 25 mg twice daily with additional doses as needed for palpitations.  Discussed with patient amatory cardiac telemetry is not indicated at this time as suspicion for significant cardiac arrhythmias is low.  However if  symptoms fail to improve or persist, could consider cardiac monitor in the future.  Encourage patient to continue to focus on diet and lifestyle modifications as well as discontinuation of tobacco use. I have also encouraged him to follow up with PCP for further evaluation and treatment of anxiety.   Follow-up in 1 year, sooner if needed, for palpitations.   Alethia Berthold, PA-C 09/10/2021, 9:35 AM Office: [phone]

## 2021-12-28 ENCOUNTER — Encounter: Payer: Self-pay | Admitting: Neurology

## 2022-01-09 NOTE — Progress Notes (Signed)
Initial neurology clinic note  Isaac Dickson MRN: [id number] DOB: 17-Jun-1975  Referring provider: Lindaann Pascal, PA-C  Primary care provider: Lindaann Pascal, PA-C  Reason for consult:  intermittent left eye and facial twitching  Subjective:  This is Isaac Dickson, a 47 y.o. right-handed male with a medical history of HTN, HLD, GERD, current smokeless tobacco user who presents to neurology clinic with left facial twitching. The patient is accompanied by girlfriend, Isaac Dickson.  Symptoms started about 7-8 months ago. He noticed while talking or eating his left eye would start twitch. He points directly under the eye. It seems to worsen with stress. Deep breathing and relaxing may help. Holding it down can help, maybe, but he is not able to stop it immediately. He denies symptoms on the right. Per girlfriend, she has seen him napping in the car with the twitching ongoing. Since onset, the twitching has worsened (more intense and regular, more often).  He denies associated symptoms including headaches, vision changes, swallowing difficulty, problems chewing, or shortness of breathing. He denies any numbness, tingling, or weakness otherwise.  He denies previous episodes of similar symptoms. He denies history of tics or other spasms.  Per clinic notes from [place] [facility] at [facility] for symptomes. Patient was referred to neurology for these symptoms.  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  MEDICATIONS:  Outpatient Encounter Medications as of 01/11/2022  Medication Sig   amphetamine-dextroamphetamine (ADDERALL) 10 MG tablet Take 15 mg by mouth 2 (two) times daily with a meal.   lisinopril (ZESTRIL) 5 MG tablet Take 1 tablet by mouth daily. 10 mg daily   metoprolol tartrate (LOPRESSOR) 25 MG tablet TAKE 1 TABLET BY MOUTH TWICE DAILY WITH ADDITIONAL DOSES AS NEEDED, UP TO AN ADDITIONAL 50MG  PER DAY   rosuvastatin (CRESTOR) 20 MG tablet TAKE 1 TABLET(20 MG) BY MOUTH DAILY   nicotine  (NICODERM CQ - DOSED IN MG/24 HOURS) 14 mg/24hr patch Place 1 patch (14 mg total) onto the skin daily. (Patient not taking: Reported on 01/11/2022)   nicotine (NICODERM CQ - DOSED IN MG/24 HOURS) 21 mg/24hr patch Place 1 patch (21 mg total) onto the skin daily. (Patient not taking: Reported on 01/11/2022)   No facility-administered encounter medications on file as of 01/11/2022.    PAST MEDICAL HISTORY: Past Medical History:  Diagnosis Date   Hyperlipidemia    Hypertension     PAST SURGICAL HISTORY: History reviewed. No pertinent surgical history.  ALLERGIES: No Known Allergies  FAMILY HISTORY: Family History  Problem Relation Age of Onset   Hypertension Father    Lung cancer Father     SOCIAL HISTORY: Social History   Tobacco Use   Smoking status: Never   Smokeless tobacco: Current    Types: Chew    Last attempt to quit: 02/15/2019   Tobacco comments:    Dips   Vaping Use   Vaping Use: Never used  Substance Use Topics   Alcohol use: Yes    Comment: occasonal   Drug use: No   Social History   Social History Narrative   Right handed    Caffeine 1-2 day   Lives in a two level but lives on the one    Objective:  Vital Signs:  BP 122/87   Pulse 89   Ht 6' (1.829 m)   Wt 241 lb (109.3 kg)   SpO2 99%   BMI 32.69 kg/m   General: No acute distress.  Patient appears well-groomed.  Head:  Normocephalic/atraumatic Face: Fine twitching of left lower eyelid and left cheek Neck: supple Lungs: Non-labored breathing on room air.  Neurological Exam: Mental status: alert and oriented to person, place, and time, speech fluent and not dysarthric, language intact.  Cranial nerves: CN I: not tested CN II: pupils equal, round and reactive to light, visual fields intact CN III, IV, VI:  full range of motion, no nystagmus, no ptosis CN V: facial sensation intact. CN VII: upper and lower face symmetric CN VIII: hearing intact CN IX, X: gag intact, uvula midline CN XI:  sternocleidomastoid and trapezius muscles intact CN XII: tongue midline  Bulk & Tone: normal Motor:  muscle strength 5/5 throughout Deep Tendon Reflexes:  2+ throughout  Sensation:  Intact to light touch throughout Finger to nose testing:  Without dysmetria.     Gait:  Normal station and stride.   Labs and Imaging review: Out-side paper records, electronic medical record, and images have been reviewed where available and summarized as:  Outside labs: HbA1c: 5.3 (10/26/21) TSH: 1.35 (09/25/21) CBC, CMP unremarkable  Assessment/Plan:  Isaac Dickson is a 46 y.o. male who presents for evaluation of intermittent twitching of the left eye and face. He has a relevant medical history of HTN, HLD, GERD. His neurological examination is pertinent for fine twitching of left lower eyelid and left cheek.   Overall, patient's history and examination are most consistent with hemifacial spasm. Will obtain MRI/MRA brain to ensure no structural lesions that may explain symptoms.  PLAN: -MRI brain w/wo contrast; MRA head wo contrast -Botox for left hemifacial spasm  -Return to clinic in 3 months  The impression above as well as the plan as outlined below were extensively discussed with the patient (in the company of girlfriend) who voiced understanding. All questions were answered to their satisfaction.  When available, results of the above investigations and possible further recommendations will be communicated to the patient via telephone/MyChart. Patient to call office if not contacted after expected testing turnaround time.   Total time spent reviewing records, interview, history/exam, documentation, and coordination of care on day of encounter:  50 min   Thank you for allowing me to participate in patient's care.  If I can answer any additional questions, I would be pleased to do so.  Jacquelyne Balint, MD   CC: Long, [place], PA-C [address] [place] [address]  CC: Referring  provider: Lindaann Pascal, PA-C [address] [place],  [place] [address]

## 2022-01-11 ENCOUNTER — Ambulatory Visit: Payer: Managed Care, Other (non HMO) | Admitting: Neurology

## 2022-01-11 ENCOUNTER — Encounter: Payer: Self-pay | Admitting: Neurology

## 2022-01-11 VITALS — BP 122/87 | HR 89 | Ht 72.0 in | Wt 241.0 lb

## 2022-01-11 DIAGNOSIS — G5132 Clonic hemifacial spasm, left: Secondary | ICD-10-CM | POA: Diagnosis not present

## 2022-01-11 DIAGNOSIS — G514 Facial myokymia: Secondary | ICD-10-CM | POA: Diagnosis not present

## 2022-01-11 NOTE — Patient Instructions (Signed)
I saw you today for left facial twitching. Your symptoms are most consistent with hemifacial spasm.  We need to check an MRI of your brain and blood vessels in your head (MRA) to make sure there is no brain or blood vessel problem. I will be in touch when I have the results of your testing.  I am recommending botox for your hemifacial spasm. We will work on approval for this and be in touch with you.  Follow up with me in about 3 months.  Please let me know if you have any questions or concerns in the meantime.  Jacquelyne Balint, MD  The physicians and staff at [facility] are committed to providing excellent care. You may receive a survey requesting feedback about your experience at our office. We strive to receive "very good" responses to the survey questions. If you feel that your experience would prevent you from giving the office a "very good " response, please contact our office to try to remedy the situation. We may be reached at [phone]. Thank you for taking the time out of your busy day to complete the survey.
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
## 2022-01-30 ENCOUNTER — Ambulatory Visit: Payer: Managed Care, Other (non HMO) | Admitting: Neurology

## 2022-01-30 ENCOUNTER — Encounter: Payer: Self-pay | Admitting: Neurology

## 2022-01-30 ENCOUNTER — Telehealth: Payer: Self-pay

## 2022-01-30 VITALS — BP 115/82 | HR 81 | Ht 72.0 in | Wt 238.6 lb

## 2022-01-30 DIAGNOSIS — G5132 Clonic hemifacial spasm, left: Secondary | ICD-10-CM

## 2022-01-30 NOTE — Telephone Encounter (Signed)
Pa request has been sent to PA team for Botox 100 units

## 2022-02-02 ENCOUNTER — Other Ambulatory Visit: Payer: Managed Care, Other (non HMO)

## 2022-02-04 ENCOUNTER — Other Ambulatory Visit (HOSPITAL_COMMUNITY): Payer: Self-pay

## 2022-02-04 ENCOUNTER — Telehealth: Payer: Self-pay

## 2022-02-04 NOTE — Telephone Encounter (Signed)
   Requested BIV through [employer] portal: [id number]. Pt enrolled into copay card program, copy has been sent to scan center as well.

## 2022-02-08 NOTE — Telephone Encounter (Signed)
BIV results are as follows, will begin PA process.   Submitted a Prior Authorization request to [employer] for  BOTOX  via CoverMyMeds. Will update once we receive a response.   Key: [id number]

## 2022-02-11 ENCOUNTER — Other Ambulatory Visit (HOSPITAL_COMMUNITY): Payer: Self-pay

## 2022-02-11 NOTE — Telephone Encounter (Signed)
Received notification from [place] regarding a prior authorization for  BOTOX 100 . Authorization has been APPROVED from 02/11/2022 to 02/11/2023.   Per test claim, copay for 28 days supply is $70.00 ($0 with copay card)  Authorization # : [id number]  Pt does not appear to have any restrictions as to which pharmacy he will need to fill through, Rx can be sent along with copay card info:  RxBIN: [id number] RxPCN: [id number] RxGRP: [id number] ID#: [id number]

## 2022-02-12 ENCOUNTER — Ambulatory Visit
Admission: RE | Admit: 2022-02-12 | Discharge: 2022-02-12 | Disposition: A | Discharge status: Home / Self Care | Payer: Managed Care, Other (non HMO) | Source: Ambulatory Visit | Attending: Neurology

## 2022-02-12 ENCOUNTER — Ambulatory Visit
Admission: RE | Admit: 2022-02-12 | Discharge: 2022-02-12 | Disposition: A | Discharge status: Home / Self Care | Payer: Managed Care, Other (non HMO) | Source: Ambulatory Visit | Attending: Neurology | Admitting: Neurology

## 2022-02-12 DIAGNOSIS — G514 Facial myokymia: Secondary | ICD-10-CM

## 2022-02-12 DIAGNOSIS — G5132 Clonic hemifacial spasm, left: Secondary | ICD-10-CM

## 2022-02-12 MED ORDER — GADOBENATE DIMEGLUMINE 529 MG/ML IV SOLN
20.0000 mL | Freq: Once | INTRAVENOUS | Status: AC | PRN
  Administered 2022-02-12: 20 mL via INTRAVENOUS

## 2022-02-13 ENCOUNTER — Other Ambulatory Visit: Payer: Self-pay

## 2022-02-13 ENCOUNTER — Other Ambulatory Visit (HOSPITAL_COMMUNITY): Payer: Self-pay

## 2022-02-13 DIAGNOSIS — G5132 Clonic hemifacial spasm, left: Secondary | ICD-10-CM

## 2022-02-13 MED ORDER — BOTOX 100 UNITS IJ SOLR: 100.0000 [IU] | Freq: Once | INTRAMUSCULAR | 0 refills | Status: DC

## 2022-02-13 MED ORDER — BOTOX 100 UNITS IJ SOLR
INTRAMUSCULAR | 0 refills | Status: DC
  Filled 2022-02-13 (×2): qty 1, 90d supply, fill #0
  Filled 2022-02-13: qty 1, fill #0

## 2022-02-13 NOTE — Telephone Encounter (Signed)
RX has been sent to [facility]

## 2022-02-14 ENCOUNTER — Other Ambulatory Visit (HOSPITAL_COMMUNITY): Payer: Self-pay

## 2022-02-16 ENCOUNTER — Other Ambulatory Visit (HOSPITAL_COMMUNITY): Payer: Self-pay

## 2022-02-18 ENCOUNTER — Other Ambulatory Visit (HOSPITAL_COMMUNITY): Payer: Self-pay

## 2022-02-22 ENCOUNTER — Other Ambulatory Visit (HOSPITAL_COMMUNITY): Payer: Self-pay

## 2022-03-01 ENCOUNTER — Ambulatory Visit: Payer: Managed Care, Other (non HMO) | Admitting: Neurology

## 2022-03-01 DIAGNOSIS — G5132 Clonic hemifacial spasm, left: Secondary | ICD-10-CM | POA: Diagnosis not present

## 2022-03-01 MED ORDER — ONABOTULINUMTOXINA 100 UNITS IJ SOLR
100.0000 [IU] | Freq: Once | INTRAMUSCULAR | Status: AC
  Administered 2022-03-01: 20 [IU] via INTRAMUSCULAR

## 2022-03-01 NOTE — Procedures (Signed)
Botulinum Clinic   History:  Diagnosis: Hemifacial spasm   Initial side: left   Result History  N/a  Consent obtained from: The patient Benefits discussed included, but were not limited to decreased muscle tightness, increased joint range of motion, and decreased pain.  Risk discussed included, but were not limited pain and discomfort, bleeding, bruising, excessive weakness, venous thrombosis, muscle atrophy and dysphagia.  A copy of the patient medication guide was given to the patient which explains the blackbox warning.  Patients identity and treatment sites confirmed Yes.  .  Details of Procedure: Skin was cleaned with alcohol.  A 30 gauge, 1/2 inch needle was introduced to the target muscle.  Prior to injection, the needle plunger was aspirated to make sure the needle was not within a blood vessel.  There was no blood retrieved on aspiration.    Following is a summary of the muscles injected  And the amount of Botulinum toxin used:  Injections  Location Left  Right Units Number of sites        Corrugator      Frontalis      Lower Lid, Lateral 2.5  2.5 1  Lower Lid Medial 2.5  2.5 1  Upper Lid, Lateral 2.5  2.5 1  Upper Lid, Medial      Canthus 5.0  5.0 1  nasalis 2.5  2.5 1  Masseter      Procerus      Zygomaticus Major 2.5/2.5  5.0 2  TOTAL UNITS:   20    Agent: Botulinum Type A ( Onobotulinum Toxin type A ).  1 vials of Botox were used, each containing 50 units and freshly diluted with 2 mL of sterile, non-perserved saline   Total injected (Units):  20  Total wasted (Units): 80 Pt tolerated procedure well without complications.   Reinjection is anticipated in 3 months.

## 2022-03-26 ENCOUNTER — Ambulatory Visit: Payer: Managed Care, Other (non HMO) | Admitting: Neurology

## 2022-04-25 ENCOUNTER — Ambulatory Visit: Payer: Managed Care, Other (non HMO) | Admitting: Neurology

## 2022-04-29 ENCOUNTER — Other Ambulatory Visit: Payer: Self-pay

## 2022-04-29 ENCOUNTER — Emergency Department (HOSPITAL_BASED_OUTPATIENT_CLINIC_OR_DEPARTMENT_OTHER)
Admission: EM | Admit: 2022-04-29 | Discharge: 2022-04-29 | Disposition: A | Discharge status: Home / Self Care | Payer: Managed Care, Other (non HMO) | Attending: Emergency Medicine | Admitting: Emergency Medicine

## 2022-04-29 ENCOUNTER — Encounter (HOSPITAL_BASED_OUTPATIENT_CLINIC_OR_DEPARTMENT_OTHER): Payer: Self-pay | Admitting: Emergency Medicine

## 2022-04-29 ENCOUNTER — Emergency Department (HOSPITAL_BASED_OUTPATIENT_CLINIC_OR_DEPARTMENT_OTHER): Payer: Managed Care, Other (non HMO) | Admitting: Radiology

## 2022-04-29 DIAGNOSIS — I10 Essential (primary) hypertension: Secondary | ICD-10-CM | POA: Insufficient documentation

## 2022-04-29 DIAGNOSIS — R000 Tachycardia, unspecified: Secondary | ICD-10-CM | POA: Diagnosis not present

## 2022-04-29 DIAGNOSIS — Z79899 Other long term (current) drug therapy: Secondary | ICD-10-CM | POA: Insufficient documentation

## 2022-04-29 DIAGNOSIS — R0789 Other chest pain: Secondary | ICD-10-CM | POA: Insufficient documentation

## 2022-04-29 DIAGNOSIS — R079 Chest pain, unspecified: Secondary | ICD-10-CM

## 2022-04-29 LAB — CBC WITH DIFFERENTIAL/PLATELET
Abs Immature Granulocytes: 0.04 10*3/uL (ref 0.00–0.07)
Basophils Absolute: 0.1 10*3/uL (ref 0.0–0.1)
Basophils Relative: 1 %
Eosinophils Absolute: 0.1 10*3/uL (ref 0.0–0.5)
Eosinophils Relative: 1 %
HCT: 44.7 % (ref 39.0–52.0)
Hemoglobin: 15.1 g/dL (ref 13.0–17.0)
Immature Granulocytes: 1 %
Lymphocytes Relative: 18 %
Lymphs Abs: 1.3 10*3/uL (ref 0.7–4.0)
MCH: 28.1 pg (ref 26.0–34.0)
MCHC: 33.8 g/dL (ref 30.0–36.0)
MCV: 83.2 fL (ref 80.0–100.0)
Monocytes Absolute: 0.7 10*3/uL (ref 0.1–1.0)
Monocytes Relative: 10 %
Neutro Abs: 5.2 10*3/uL (ref 1.7–7.7)
Neutrophils Relative %: 69 %
Platelets: 271 10*3/uL (ref 150–400)
RBC: 5.37 MIL/uL (ref 4.22–5.81)
RDW: 12.6 % (ref 11.5–15.5)
WBC: 7.4 10*3/uL (ref 4.0–10.5)
nRBC: 0 % (ref 0.0–0.2)

## 2022-04-29 LAB — BASIC METABOLIC PANEL
Anion gap: 10 (ref 5–15)
BUN: 8 mg/dL (ref 6–20)
CO2: 29 mmol/L (ref 22–32)
Calcium: 9.6 mg/dL (ref 8.9–10.3)
Chloride: 100 mmol/L (ref 98–111)
Creatinine, Ser: 1.07 mg/dL (ref 0.61–1.24)
GFR, Estimated: >60 mL/min (ref >60)
Glucose, Bld: 122 mg/dL — ABNORMAL HIGH (ref 70–99)
Potassium: 3.5 mmol/L (ref 3.5–5.1)
Sodium: 139 mmol/L (ref 135–145)

## 2022-04-29 LAB — TROPONIN I (HIGH SENSITIVITY): Troponin I (High Sensitivity): <2 ng/L (ref <18)

## 2022-04-29 LAB — D-DIMER, QUANTITATIVE: D-Dimer, Quant: <0.27 ug/mL-FEU (ref 0.00–0.50)

## 2022-04-29 MED ORDER — LIDOCAINE VISCOUS HCL 2 % MT SOLN: 15.0000 mL | Freq: Once | OROMUCOSAL | Status: DC

## 2022-04-29 MED ORDER — ALUM & MAG HYDROXIDE-SIMETH 200-200-20 MG/5ML PO SUSP: 30.0000 mL | Freq: Once | ORAL | Status: DC

## 2022-04-29 NOTE — ED Notes (Signed)
Dc instructions reviewed with patient. Patient voiced understanding. Dc with belongings.  °

## 2022-04-29 NOTE — ED Provider Notes (Signed)
[facility] EMERGENCY DEPT Provider Note   CSN: [id number] Arrival date & time: 04/29/22  0940     History  Chief Complaint  Patient presents with   Chest Pain    Isaac Dickson is a 46 y.o. male.   Chest Pain    Patient with medical history of hypertension and, hyperlipidemia, ADHD on Adderall presents to the emergency department due to chest tightness.  Started 3 days ago, some left upper side of his chest without radiation elsewhere.  Comes and goes, unable to identify provoking or alleviating factors.  It feels somewhat like indigestion.  He felt nauseated but denies any vomiting.  He does feel somewhat short of breath.  No recent travel or surgeries, no history of PE or ACS.  Denies family history of ACS.  States he has also been having elevated heart rate in the 120s to 140s per his watch.  He did take Adderall today but states he has been on Adderall for years without any tachycardia.  He endorses cough which has been nonproductive, he was on a Z-Pak about a week ago with no real improvement.  Patient does endorse being under increased stress at this time a year with the holidays approaching and 2 young children at home.  His wife died unexpectedly in Dec 19, 2022 of last year so this is a hard time.  Home Medications Prior to Admission medications   Medication Sig Start Date End Date Taking? Authorizing Provider  amphetamine-dextroamphetamine (ADDERALL) 10 MG tablet Take 15 mg by mouth 2 (two) times daily with a meal.    [provider]  botulinum toxin Type A (BOTOX) 100 units SOLR injection INJECT 100 UNITS INTO THE HEAD AND NECK EVERY 90 DAYS IN THE DR OFFICE BY THE MD 02/13/22   Tat, Octaviano Batty, DO  lisinopril (ZESTRIL) 5 MG tablet Take 1 tablet by mouth daily. 10 mg daily    [provider]  metoprolol tartrate (LOPRESSOR) 25 MG tablet TAKE 1 TABLET BY MOUTH TWICE DAILY WITH ADDITIONAL DOSES AS NEEDED, UP TO AN ADDITIONAL 50MG  PER DAY 05/23/21    Cantwell, Celeste C, PA-C  nicotine (NICODERM CQ - DOSED IN MG/24 HOURS) 14 mg/24hr patch Place 1 patch (14 mg total) onto the skin daily. Patient not taking: Reported on 01/30/2022 08/03/20   Cantwell, Celeste C, PA-C  nicotine (NICODERM CQ - DOSED IN MG/24 HOURS) 21 mg/24hr patch Place 1 patch (21 mg total) onto the skin daily. Patient not taking: Reported on 01/30/2022 08/03/20   Cantwell, Celeste C, PA-C  rosuvastatin (CRESTOR) 20 MG tablet TAKE 1 TABLET(20 MG) BY MOUTH DAILY 04/27/20   Cantwell, Celeste C, PA-C      Allergies    Patient has no known allergies.    Review of Systems   Review of Systems  Cardiovascular:  Positive for chest pain.    Physical Exam Updated Vital Signs BP (!) 120/93 (BP Location: Right Arm)   Pulse 100   Temp 98.3 F (36.8 C) (Oral)   Resp (!) 21   SpO2 100%  Physical Exam Vitals and nursing note reviewed. Exam conducted with a chaperone present.  Constitutional:      Appearance: Normal appearance.  HENT:     Head: Normocephalic and atraumatic.  Eyes:     General: No scleral icterus.       Right eye: No discharge.        Left eye: No discharge.     Extraocular Movements: Extraocular movements intact.  Pupils: Pupils are equal, round, and reactive to light.  Cardiovascular:     Rate and Rhythm: Regular rhythm. Tachycardia present.     Pulses: Normal pulses.     Heart sounds: Normal heart sounds. No murmur heard.    No friction rub. No gallop.  Pulmonary:     Effort: Pulmonary effort is normal. No respiratory distress.     Breath sounds: Normal breath sounds.  Abdominal:     General: Abdomen is flat. Bowel sounds are normal. There is no distension.     Palpations: Abdomen is soft.     Tenderness: There is no abdominal tenderness.  Skin:    General: Skin is warm and dry.     Coloration: Skin is not jaundiced.  Neurological:     Mental Status: He is alert. Mental status is at baseline.     Coordination: Coordination normal.  Psychiatric:         Mood and Affect: Mood is anxious.     ED Results / Procedures / Treatments   Labs (all labs ordered are listed, but only abnormal results are displayed) Labs Reviewed  BASIC METABOLIC PANEL - Abnormal; Notable for the following components:      Result Value   Glucose, Bld 122 (*)    All other components within normal limits  CBC WITH DIFFERENTIAL/PLATELET  D-DIMER, QUANTITATIVE  TROPONIN I (HIGH SENSITIVITY)    EKG EKG Interpretation  Date/Time:  Monday April 29 2022 09:52:11 EST Ventricular Rate:  106 PR Interval:  134 QRS Duration: 82 QT Interval:  332 QTC Calculation: 441 R Axis:   14 Text Interpretation: Sinus tachycardia Cannot rule out Anterior infarct (cited on or before 29-Apr-2022) Abnormal ECG No significant change since last tracing Confirmed by Melene Plan [id number]) on 04/29/2022 9:57:53 AM  Radiology DG Chest 2 View  Result Date: 04/29/2022 CLINICAL DATA:  Shortness of breath, tachycardia, chest tightness. EXAM: CHEST - 2 VIEW COMPARISON:  04/10/2021. FINDINGS: Trachea is midline.  Heart size normal.  Lungs are clear fluid. IMPRESSION: No acute findings. Electronically Signed   By: Leanna Battles M.D.   On: 04/29/2022 10:13    Procedures Procedures    Medications Ordered in ED Medications - No data to display  ED Course/ Medical Decision Making/ A&P                            Medical Decision Making Amount and/or Complexity of Data Reviewed Labs: ordered. Decision-making details documented in ED Course. Radiology: ordered.  Risk OTC drugs. Prescription drug management.   Patient presents due to chest pain.  Differential includes but not limited to ACS, PE, pneumonia, arrhythmia, dissection, PNA, esophageal rupture, costochondritis, metabolic abnormality.  I viewed external medical records.  Patient takes medication for hypertension and hyperlipidemia.  I ordered, viewed and interpreted laboratory workup. CBC without leukocytosis or  anemia. BMP without gross electrolyte derangement or AKI. Troponin is nondetectable dimer is negative.  EKG shows sinus rhythm, abnormal EKG but no ischemic changes compared to prior.  Patient is on cardiac monitoring in sinus tachycardia with a rate of 102 per my interpretation.  I ordered the chest x-ray which is negative for underlying pneumonia or infectious etiology.  No widened mediastinum.  No acute abnormalities.  I reevaluated patient he was chest pain-free currently.  No Considered additional workup but do not think indicated.  Given dimer is low I do not think this is PE.  Given negative troponin and  no ischemic changes on EKG I do not think this is ACS.  Additionally patient's heart score is 2 making him somewhat low risk.  At this time I do feel patient is appropriate for close outpatient follow-up.          Final Clinical Impression(s) / ED Diagnoses Final diagnoses:  Chest pain, unspecified type    Rx / DC Orders ED Discharge Orders     None         Theron Arista, PA-C 04/29/22 1057    Melene Plan, DO 04/29/22 1248

## 2022-04-29 NOTE — ED Triage Notes (Signed)
Pt reports 2 days of his heart rate being 120-150 per his watch, feels tightness in his upper left chest. Pt appears to be short of breath, tires easily

## 2022-04-29 NOTE — Discharge Instructions (Signed)
Workup today was reassuring as discussed.  Work on mitigating her stress.  Follow-up with your main doctor this week for reevaluation.  Return to ED for new or concerning symptoms.

## 2022-05-07 NOTE — Progress Notes (Unsigned)
   Assessment/Plan:   Left hemifacial spasm  -Status post Botox injections on October 13.  He is happy with injections but may have worn off a little early.  Will just slightly increase dosage.  He is also noting a little asymmetry at the lateral orbicularis so we will do both next time.  Told him if it continues to wear off, we could trial xeomin. Subjective:   Isaac Dickson was seen today in follow up for hemifacial spasm, left.  Patient had first series of Botox injections October 13.  Patient reports this is working well.  Patient was in the emergency room December 11 with chest pain.  Emergency room workup was unremarkable and patient was discharged home.   CURRENT MEDICATIONS:  Outpatient Encounter Medications as of 05/09/2022  Medication Sig   amphetamine-dextroamphetamine (ADDERALL) 10 MG tablet Take 15 mg by mouth 2 (two) times daily with a meal.   botulinum toxin Type A (BOTOX) 100 units SOLR injection INJECT 100 UNITS INTO THE HEAD AND NECK EVERY 90 DAYS IN THE DR OFFICE BY THE MD   lisinopril (ZESTRIL) 5 MG tablet Take 1 tablet by mouth daily. 10 mg daily   metoprolol tartrate (LOPRESSOR) 25 MG tablet TAKE 1 TABLET BY MOUTH TWICE DAILY WITH ADDITIONAL DOSES AS NEEDED, UP TO AN ADDITIONAL 50MG  PER DAY   nicotine (NICODERM CQ - DOSED IN MG/24 HOURS) 21 mg/24hr patch Place 1 patch (21 mg total) onto the skin daily.   rosuvastatin (CRESTOR) 20 MG tablet TAKE 1 TABLET(20 MG) BY MOUTH DAILY   [DISCONTINUED] nicotine (NICODERM CQ - DOSED IN MG/24 HOURS) 14 mg/24hr patch Place 1 patch (14 mg total) onto the skin daily. (Patient not taking: Reported on 01/30/2022)   No facility-administered encounter medications on file as of 05/09/2022.     Objective:   PHYSICAL EXAMINATION:    VITALS:   Vitals:   05/09/22 0910  BP: (!) 130/90  Pulse: 62  SpO2: 98%  Weight: 162 lb 3.2 oz (73.6 kg)  Height: 6' (1.829 m)   Wt Readings from Last 3 Encounters:  05/09/22 162 lb 3.2 oz (73.6 kg)   01/30/22 238 lb 9.6 oz (108.2 kg)  01/11/22 241 lb (109.3 kg)     GEN:  The patient appears stated age and is in NAD. HEENT:  Normocephalic, atraumatic.  The mucous membranes are moist. The superficial temporal arteries are without ropiness or tenderness. CV:  RRR Lungs:  CTAB Neck/HEME:  There are no carotid bruits bilaterally.  Neurological examination:  Orientation: The patient is alert and oriented x3. Cranial nerves: There is good facial symmetry.  Rare orbicularis spasm noted today on the LThe speech is fluent and clear. Soft palate rises symmetrically and there is no tongue deviation. Hearing is intact to conversational tone. Sensation: Sensation is intact to light touch throughout Motor: Strength is at least antigravity x4.   Cc:  Long, 01/13/22, PA-C

## 2022-05-09 ENCOUNTER — Encounter: Payer: Self-pay | Admitting: Neurology

## 2022-05-09 ENCOUNTER — Ambulatory Visit: Payer: Managed Care, Other (non HMO) | Admitting: Neurology

## 2022-05-09 VITALS — BP 130/90 | HR 62 | Ht 72.0 in | Wt 224.0 lb

## 2022-05-09 DIAGNOSIS — G5132 Clonic hemifacial spasm, left: Secondary | ICD-10-CM

## 2022-05-14 ENCOUNTER — Other Ambulatory Visit: Payer: Self-pay | Admitting: Neurology

## 2022-05-14 ENCOUNTER — Other Ambulatory Visit (HOSPITAL_COMMUNITY): Payer: Self-pay

## 2022-05-14 DIAGNOSIS — G5132 Clonic hemifacial spasm, left: Secondary | ICD-10-CM

## 2022-05-14 MED ORDER — BOTOX 100 UNITS IJ SOLR
INTRAMUSCULAR | 2 refills | Status: DC
  Filled 2022-05-14: qty 1, 90d supply, fill #0
  Filled 2022-08-14: qty 1, 90d supply, fill #1
  Filled 2022-11-13: qty 1, 90d supply, fill #2

## 2022-05-24 ENCOUNTER — Other Ambulatory Visit: Payer: Self-pay

## 2022-05-28 ENCOUNTER — Other Ambulatory Visit: Payer: Self-pay

## 2022-05-31 ENCOUNTER — Ambulatory Visit: Payer: Managed Care, Other (non HMO) | Admitting: Neurology

## 2022-05-31 DIAGNOSIS — G5132 Clonic hemifacial spasm, left: Secondary | ICD-10-CM | POA: Diagnosis not present

## 2022-05-31 MED ORDER — ONABOTULINUMTOXINA 100 UNITS IJ SOLR
100.0000 [IU] | Freq: Once | INTRAMUSCULAR | Status: AC
  Administered 2022-05-31: 30 [IU] via INTRAMUSCULAR

## 2022-05-31 NOTE — Procedures (Signed)
[facility]   History:  Diagnosis: Hemifacial spasm   Initial side: left   Result History  N/a  Consent obtained from: The patient Benefits discussed included, but were not limited to decreased muscle tightness, increased joint range of motion, and decreased pain.  Risk discussed included, but were not limited pain and discomfort, bleeding, bruising, excessive weakness, venous thrombosis, muscle atrophy and dysphagia.  A copy of the patient medication guide was given to the patient which explains the blackbox warning.  Patients identity and treatment sites confirmed Yes.  .  Details of Procedure: Skin was cleaned with alcohol.  A 30 gauge, 1/2 inch needle was introduced to the target muscle.  Prior to injection, the needle plunger was aspirated to make sure the needle was not within a blood vessel.  There was no blood retrieved on aspiration.    Following is a summary of the muscles injected  And the amount of Botulinum toxin used:  Injections  Location Left  Right Units Number of sites        Corrugator      Frontalis      Lower Lid, Lateral 5.0  5.0 1  Lower Lid Medial 2.5  2.5 1  Upper Lid, Lateral 5.0  5.0 1  Upper Lid, Medial      Canthus 5.0 5.0 10 1  nasalis 2.5  2.5 1  Masseter      Procerus      Zygomaticus Major 2.5/2.5  5.0 2  TOTAL UNITS:   30    Agent: Botulinum Type A ( Onobotulinum Toxin type A ).  1 vials of Botox were used, each containing 50 units and freshly diluted with 2 mL of sterile, non-perserved saline   Total injected (Units): 30  Total wasted (Units): 70 Pt tolerated procedure well without complications.   Reinjection is anticipated in 3 months.

## 2022-06-02 ENCOUNTER — Other Ambulatory Visit: Payer: Self-pay

## 2022-06-02 ENCOUNTER — Emergency Department (HOSPITAL_COMMUNITY)
Admission: EM | Admit: 2022-06-02 | Discharge: 2022-06-03 | Disposition: A | Discharge status: Home / Self Care | Payer: Managed Care, Other (non HMO) | Attending: Emergency Medicine | Admitting: Emergency Medicine

## 2022-06-02 ENCOUNTER — Encounter (HOSPITAL_COMMUNITY): Payer: Self-pay

## 2022-06-02 ENCOUNTER — Emergency Department (HOSPITAL_COMMUNITY): Payer: Managed Care, Other (non HMO)

## 2022-06-02 DIAGNOSIS — N289 Disorder of kidney and ureter, unspecified: Secondary | ICD-10-CM | POA: Diagnosis not present

## 2022-06-02 DIAGNOSIS — F10129 Alcohol abuse with intoxication, unspecified: Secondary | ICD-10-CM | POA: Diagnosis not present

## 2022-06-02 DIAGNOSIS — Z79899 Other long term (current) drug therapy: Secondary | ICD-10-CM | POA: Diagnosis not present

## 2022-06-02 DIAGNOSIS — Y92410 Unspecified street and highway as the place of occurrence of the external cause: Secondary | ICD-10-CM | POA: Insufficient documentation

## 2022-06-02 DIAGNOSIS — S0031XA Abrasion of nose, initial encounter: Secondary | ICD-10-CM | POA: Diagnosis not present

## 2022-06-02 DIAGNOSIS — S298XXA Other specified injuries of thorax, initial encounter: Secondary | ICD-10-CM

## 2022-06-02 DIAGNOSIS — F10920 Alcohol use, unspecified with intoxication, uncomplicated: Secondary | ICD-10-CM

## 2022-06-02 DIAGNOSIS — S0992XA Unspecified injury of nose, initial encounter: Secondary | ICD-10-CM | POA: Diagnosis present

## 2022-06-02 DIAGNOSIS — Y908 Blood alcohol level of 240 mg/100 ml or more: Secondary | ICD-10-CM | POA: Diagnosis not present

## 2022-06-02 DIAGNOSIS — S3991XA Unspecified injury of abdomen, initial encounter: Secondary | ICD-10-CM | POA: Diagnosis not present

## 2022-06-02 DIAGNOSIS — Z23 Encounter for immunization: Secondary | ICD-10-CM | POA: Diagnosis not present

## 2022-06-02 DIAGNOSIS — S0990XA Unspecified injury of head, initial encounter: Secondary | ICD-10-CM | POA: Insufficient documentation

## 2022-06-02 DIAGNOSIS — I10 Essential (primary) hypertension: Secondary | ICD-10-CM | POA: Diagnosis not present

## 2022-06-02 DIAGNOSIS — S299XXA Unspecified injury of thorax, initial encounter: Secondary | ICD-10-CM | POA: Diagnosis not present

## 2022-06-02 LAB — I-STAT CHEM 8, ED
BUN: 5 mg/dL — ABNORMAL LOW (ref 6–20)
Calcium, Ion: 0.93 mmol/L — ABNORMAL LOW (ref 1.15–1.40)
Chloride: 101 mmol/L (ref 98–111)
Creatinine, Ser: 1.4 mg/dL — ABNORMAL HIGH (ref 0.61–1.24)
Glucose, Bld: 103 mg/dL — ABNORMAL HIGH (ref 70–99)
HCT: 45 % (ref 39.0–52.0)
Hemoglobin: 15.3 g/dL (ref 13.0–17.0)
Potassium: 4.7 mmol/L (ref 3.5–5.1)
Sodium: 135 mmol/L (ref 135–145)
TCO2: 23 mmol/L (ref 22–32)

## 2022-06-02 MED ORDER — TETANUS-DIPHTH-ACELL PERTUSSIS 5-2.5-18.5 LF-MCG/0.5 IM SUSY
PREFILLED_SYRINGE | INTRAMUSCULAR | Status: AC
  Filled 2022-06-02: qty 0.5

## 2022-06-02 MED ORDER — TETANUS-DIPHTH-ACELL PERTUSSIS 5-2.5-18.5 LF-MCG/0.5 IM SUSY
0.5000 mL | PREFILLED_SYRINGE | Freq: Once | INTRAMUSCULAR | Status: AC
  Administered 2022-06-02: 0.5 mL via INTRAMUSCULAR
  Filled 2022-06-02: qty 0.5

## 2022-06-02 NOTE — ED Notes (Signed)
Thuy NT at bedside for legal blood draw
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
## 2022-06-03 ENCOUNTER — Emergency Department (HOSPITAL_COMMUNITY): Payer: Managed Care, Other (non HMO)

## 2022-06-03 LAB — CBC
HCT: 45.2 % (ref 39.0–52.0)
Hemoglobin: 15.1 g/dL (ref 13.0–17.0)
MCH: 28.8 pg (ref 26.0–34.0)
MCHC: 33.4 g/dL (ref 30.0–36.0)
MCV: 86.1 fL (ref 80.0–100.0)
Platelets: 203 10*3/uL (ref 150–400)
RBC: 5.25 MIL/uL (ref 4.22–5.81)
RDW: 12.4 % (ref 11.5–15.5)
WBC: 7.3 10*3/uL (ref 4.0–10.5)
nRBC: 0 % (ref 0.0–0.2)

## 2022-06-03 LAB — RAPID URINE DRUG SCREEN, HOSP PERFORMED
Amphetamines: NOT DETECTED
Barbiturates: NOT DETECTED
Benzodiazepines: NOT DETECTED
Cocaine: NOT DETECTED
Opiates: NOT DETECTED
Tetrahydrocannabinol: NOT DETECTED

## 2022-06-03 LAB — COMPREHENSIVE METABOLIC PANEL
ALT: 34 U/L (ref 0–44)
AST: 39 U/L (ref 15–41)
Albumin: 4.1 g/dL (ref 3.5–5.0)
Alkaline Phosphatase: 53 U/L (ref 38–126)
Anion gap: 11 (ref 5–15)
BUN: <5 mg/dL — ABNORMAL LOW (ref 6–20)
CO2: 22 mmol/L (ref 22–32)
Calcium: 8.5 mg/dL — ABNORMAL LOW (ref 8.9–10.3)
Chloride: 102 mmol/L (ref 98–111)
Creatinine, Ser: 1.14 mg/dL (ref 0.61–1.24)
GFR, Estimated: >60 mL/min (ref >60)
Glucose, Bld: 109 mg/dL — ABNORMAL HIGH (ref 70–99)
Potassium: 4 mmol/L (ref 3.5–5.1)
Sodium: 135 mmol/L (ref 135–145)
Total Bilirubin: 0.9 mg/dL (ref 0.3–1.2)
Total Protein: 6.3 g/dL — ABNORMAL LOW (ref 6.5–8.1)

## 2022-06-03 LAB — ETHANOL: Alcohol, Ethyl (B): 334 mg/dL (ref <10)

## 2022-06-03 LAB — PROTIME-INR
INR: 1 (ref 0.8–1.2)
Prothrombin Time: 12.9 seconds (ref 11.4–15.2)

## 2022-06-03 MED ORDER — IOHEXOL 350 MG/ML SOLN
75.0000 mL | Freq: Once | INTRAVENOUS | Status: AC | PRN
  Administered 2022-06-03: 75 mL via INTRAVENOUS

## 2022-06-03 NOTE — Discharge Instructions (Signed)
Substance Abuse Treatment Programs ° °Intensive Outpatient Programs °[facility]     °[address]      °[place], [place]                   °[phone]      ° °[facility] °[address] °[place], [place] °[phone] ° °[facility]     °(Inpatient and outpatient)     °[address]           °[phone]   ° °[facility] °[phone] (Suboxone and Methadone) ° °[address]      °[place], [place] [address]      °336-882-2125      ° °[address] °[place], [place] °852-3033 ° °[facility] (Outpatient/Inpatient, Chemical)    °(insurance only) [phone]      °       °[facility] (Groups & Residential) °[place], [place] °336-389-1413 ° °   °[facility]     °[address]     °[place], [place]      °336-389-1413      ° °[facility] (for caregivers and family) °[address] °[place], [place] °336-299-4655 ° ° ° ° ° °Residential Treatment Programs °[facility]      °[address], [place], [place] [address]  °[phone]      ° °[facility] °[address] [place], [place] [address] °[phone] ° °[facility]        °336-248-8914      ° °[facility] °1-800-659-3381 ° °[facility]             °[address]                                         °[place], [place]                                                °877-615-2722 or 336-784-9470                              ° °[facility] [place] °[address] °[place], [address] °1.877.941.8954 ° °[facility]    °[address]      °[place], [place]     °[phone]      ° °[facility] °[address] °, [place] °336-285-9073 ° °[facility]   °[address]     °[place], [place] [address]     °336-899-1550      °Admissions: 8am-3pm M-F ° °[facility] °[address] °[place],  [place] °336-227-7417 ° °[facility]: Residential Program (90 Days)   °[place], [place]      °336-725-8389 or 800-758-6077    ° °[facility] °[place], [place] °(Walk in Hours over the weekend or by referral) ° °[facility] °[address], [place],  [address] °([phone] [phone] ° °Crisis Mobile: [facility]:  1-877-626-1772 (for crisis response 24 hours a day) °[facility] Hotline:      1-800-256-2452 °Outpatient Psychiatry and Counseling ° °[facility]: Mobile Crisis   Management 24 hours:  1-877-626-1772 ° °[facility] sliding scale fee and walk in schedule: M-F 8am-12pm/1pm-3pm °[address]  °[place], [place] [address] °336-387-6161 ° °[facility] °[address] °[place], [place] [address] °336-703-9650 ° °[facility] ([facility])- new patient walk-in appointments available Monday - Friday 8am -3pm.          °[address] °[place], [place] [address] °336-676-6840 or crisis line- 336-676-6905 ° °[facility] Intensive Outpatient Therapy Program °[address] °[place], [place] [address] °336-832-9804 ° °[facility]                  °Crisis Services      °[phone]      °[address]     °[place], [place] [address]                ° °[facility]   °[facility] °[phone] °[address] °[place], [place] [address] ° ° °[facility]          °[address]  °[place], [place] [address]       °([phone] ° °[facility] °[address] °[place], [place] [address] °336-292-1510 ° °[facility]    °[address]    °[place], [place] [address]     °336-632-3505      ° °Parish McKinney, MD     °[address]     °[place] [address]     °336-282-1251     °  °[facility] °[address] °[place] [address] ° °[facility]     °[address]     °[place], [place]      °336-542-2076      ° °[facility] °Shamsher Ahluwalia, MD °[address] °[place], [place] [address] °336-420-9558 ° °[facility]     °[address]     °[place], [place] [address]     °336-274-1237      ° °[facility] °[address] °[place], [place] [address] °336-854-4450 °Resources for Temporary Residential Assistance/Crisis Centers ° °DAY CENTERS °[facility] °M-F 8am-3pm   °[address] [place], [place] [address]   336-332-0824 °Services include: laundry, barbering, support groups, case management, phone  & computer access, showers, AA/NA mtgs, mental health/substance abuse nurse, job skills class, disability information, VA assistance, spiritual classes, etc.  ° °HOMELESS SHELTERS ° °[facility]     °[facility]   °[address], [place]     °336.271.5959       °       °[facility] (women and children)       °[address] °[place], [place] [address] °336-275-0820 °[email or web address] for application and process °Application Required ° °[facility]   °[address]    °[place] [address]     °336.886.4922       °             °[facility] °[address] °,  [address] °336.273.5572 °336-235-0363(schedule application appt.) °Application Required ° °[facility] (women only)    °[address]     °[place],  [address]     °336-884-1039      °  Intake starts 6pm daily °Need valid ID, SSC, & Police report °[facility] [place] °[address] °[place], [place] °336-881-5420 °Application Required ° °[facility] (men only)     °[address]      °[place], [place]     °[phone]      ° °[facility] °(Pregnant women only) °[address] °[place], [place] °336-275-0206 ° °[facility]      °[address]      °[place], [place] [address]     °336-722-9951      °       °[facility] °[address] °[place], [place] °336-723-1848 °90 day commitment/SA/Application process ° °[facility](men only)     °[address]     °[place], [place]     °336-748-1962       °Check-in at 7pm     °       °[facility] °[address] °[place], [place] [address] °336-248-6684 °Men/Women/Women and Children must be there by 7 pm ° °[facility] °[place], [place] °336-722-8721                ° °

## 2022-06-03 NOTE — ED Notes (Signed)
Patient transported to X-ray 

## 2022-06-03 NOTE — ED Notes (Signed)
Per Jenny Reichmann, the patient's friend at the bedside in the hospital has the patient's phone and licenses at home with him according to John's phone call with this RN.

## 2022-06-03 NOTE — ED Notes (Signed)
Patient transported to CT 

## 2022-06-03 NOTE — ED Notes (Signed)
Patient provided with falls risk education  and asked multiple times to please remain seated as he is not steady on his feet. Patient continues to get up out of the bed to come to the nurses station. Other nurses in the bay are aware that the patient is a falls risk. Patient in line of sight from nurses station and refusing to stay in the bed.

## 2022-08-07 ENCOUNTER — Telehealth: Payer: Self-pay | Admitting: Neurology

## 2022-08-07 NOTE — Telephone Encounter (Signed)
-----   Message from [place], DO sent at 05/31/2022  7:15 AM EST ----- Call patient and find out how he did with last botox dosage (we increased a litte).  Did it wear off this time and any side effect issues?

## 2022-08-07 NOTE — Telephone Encounter (Signed)
Called left patient message

## 2022-08-08 NOTE — Telephone Encounter (Signed)
Sent patient a Mychart message.

## 2022-08-14 ENCOUNTER — Other Ambulatory Visit (HOSPITAL_COMMUNITY): Payer: Self-pay

## 2022-08-23 ENCOUNTER — Other Ambulatory Visit (HOSPITAL_COMMUNITY): Payer: Self-pay

## 2022-08-26 ENCOUNTER — Other Ambulatory Visit (HOSPITAL_COMMUNITY): Payer: Self-pay

## 2022-08-30 ENCOUNTER — Ambulatory Visit: Payer: Managed Care, Other (non HMO) | Admitting: Neurology

## 2022-08-30 DIAGNOSIS — G5132 Clonic hemifacial spasm, left: Secondary | ICD-10-CM

## 2022-08-30 MED ORDER — ONABOTULINUMTOXINA 100 UNITS IJ SOLR
100.0000 [IU] | Freq: Once | INTRAMUSCULAR | Status: AC
  Administered 2022-08-30: 30 [IU] via INTRAMUSCULAR

## 2022-08-30 NOTE — Procedures (Signed)
Botulinum Clinic   History:  Diagnosis: Hemifacial spasm   Initial side: left   Result History  Had some assymetry last visit where L eye seemed bigger.  Wore off last few weeks  Consent obtained from: The patient Benefits discussed included, but were not limited to decreased muscle tightness, increased joint range of motion, and decreased pain.  Risk discussed included, but were not limited pain and discomfort, bleeding, bruising, excessive weakness, venous thrombosis, muscle atrophy and dysphagia.  A copy of the patient medication guide was given to the patient which explains the blackbox warning.  Patients identity and treatment sites confirmed Yes.  .  Details of Procedure: Skin was cleaned with alcohol.  A 30 gauge, 1/2 inch needle was introduced to the target muscle.  Prior to injection, the needle plunger was aspirated to make sure the needle was not within a blood vessel.  There was no blood retrieved on aspiration.    Following is a summary of the muscles injected  And the amount of Botulinum toxin used:  Injections  Location Left  Right Units Number of sites        Corrugator      Frontalis      Lower Lid, Lateral 5.0  5.0 1  Lower Lid Medial 2.5  2.5 1  Upper Lid, Lateral 5.0  5.0 1  Upper Lid, Medial      Canthus 5.0 5.0 10 1  nasalis 2.5  2.5 1  Masseter      Procerus      Zygomaticus Major 2.5/2.5  5.0 2  TOTAL UNITS:   30    Agent: Botulinum Type A ( Onobotulinum Toxin type A ).  1 vials of Botox were used, each containing 50 units and freshly diluted with 2 mL of sterile, non-perserved saline   Total injected (Units): 30  Total wasted (Units): 70 Pt tolerated procedure well without complications.   Reinjection is anticipated in 3 months.

## 2022-11-13 ENCOUNTER — Other Ambulatory Visit (HOSPITAL_COMMUNITY): Payer: Self-pay

## 2022-11-20 ENCOUNTER — Other Ambulatory Visit (HOSPITAL_COMMUNITY): Payer: Self-pay

## 2022-11-22 ENCOUNTER — Other Ambulatory Visit: Payer: Self-pay

## 2022-11-22 ENCOUNTER — Other Ambulatory Visit (HOSPITAL_COMMUNITY): Payer: Self-pay

## 2022-11-29 ENCOUNTER — Ambulatory Visit: Payer: Managed Care, Other (non HMO) | Admitting: Neurology

## 2022-11-29 ENCOUNTER — Encounter: Payer: Self-pay | Admitting: Neurology

## 2022-11-29 DIAGNOSIS — Z029 Encounter for administrative examinations, unspecified: Secondary | ICD-10-CM

## 2022-12-06 ENCOUNTER — Ambulatory Visit: Payer: Managed Care, Other (non HMO) | Admitting: Neurology

## 2022-12-06 ENCOUNTER — Ambulatory Visit (INDEPENDENT_AMBULATORY_CARE_PROVIDER_SITE_OTHER): Payer: Managed Care, Other (non HMO) | Admitting: Neurology

## 2022-12-06 DIAGNOSIS — G5132 Clonic hemifacial spasm, left: Secondary | ICD-10-CM

## 2022-12-06 MED ORDER — ONABOTULINUMTOXINA 100 UNITS IJ SOLR
100.0000 [IU] | Freq: Once | INTRAMUSCULAR | Status: AC
  Administered 2022-12-06: 20 [IU] via INTRAMUSCULAR

## 2022-12-06 NOTE — Procedures (Signed)
Botulinum Clinic   History:  Diagnosis: Hemifacial spasm   Initial side: left   Result History  Doing well and has not noted recurrence much of the facial spasm  Consent obtained from: The patient Benefits discussed included, but were not limited to decreased muscle tightness, increased joint range of motion, and decreased pain.  Risk discussed included, but were not limited pain and discomfort, bleeding, bruising, excessive weakness, venous thrombosis, muscle atrophy and dysphagia.  A copy of the patient medication guide was given to the patient which explains the blackbox warning.  Patients identity and treatment sites confirmed Yes.  .  Details of Procedure: Skin was cleaned with alcohol.  A 30 gauge, 1/2 inch needle was introduced to the target muscle.  Prior to injection, the needle plunger was aspirated to make sure the needle was not within a blood vessel.  There was no blood retrieved on aspiration.    Following is a summary of the muscles injected  And the amount of Botulinum toxin used:  Injections  Location Left  Right Units Number of sites        Corrugator      Frontalis      Lower Lid, Lateral 2.5  2.5 1  Lower Lid Medial 2.5  2.5 1  Upper Lid, Lateral 2.5  2.5 1  Upper Lid, Medial      Canthus 5.0  5.0 1  nasalis 2.5  2.5 1  Masseter      Procerus      Zygomaticus Major 2.5/2.5  5.0 2  TOTAL UNITS:   20    Agent: Botulinum Type A ( Onobotulinum Toxin type A ).  1 vials of Botox were used, each containing 50 units and freshly diluted with 2 mL of sterile, non-perserved saline   Total injected (Units): 20  Total wasted (Units): 80 Pt tolerated procedure well without complications.   Reinjection is anticipated in 3 months.

## 2023-02-04 ENCOUNTER — Other Ambulatory Visit (HOSPITAL_COMMUNITY): Payer: Self-pay

## 2023-02-10 ENCOUNTER — Other Ambulatory Visit: Payer: Self-pay

## 2023-02-26 ENCOUNTER — Telehealth: Payer: Self-pay

## 2023-02-26 ENCOUNTER — Other Ambulatory Visit: Payer: Self-pay

## 2023-02-26 NOTE — Telephone Encounter (Signed)
cALLED [facility] TO SCHEDULE DELIVERY AND THEY LET ME KNOW THEY HAVE CALLED PATIENT SEVERAL SEVERAL TIMES WITH NO RESPONSE. I also reached out to patient and received his voicemail and left message to please call [facility] to schedule this delivery

## 2023-03-05 ENCOUNTER — Other Ambulatory Visit (HOSPITAL_COMMUNITY): Payer: Self-pay

## 2023-03-05 ENCOUNTER — Other Ambulatory Visit: Payer: Self-pay

## 2023-03-05 ENCOUNTER — Other Ambulatory Visit: Payer: Self-pay | Admitting: Neurology

## 2023-03-05 ENCOUNTER — Telehealth: Payer: Self-pay | Admitting: Pharmacy Technician

## 2023-03-05 DIAGNOSIS — G5132 Clonic hemifacial spasm, left: Secondary | ICD-10-CM

## 2023-03-05 MED ORDER — BOTOX 100 UNITS IJ SOLR
INTRAMUSCULAR | 2 refills | Status: DC
  Filled 2023-03-05: qty 1, 90d supply, fill #0
  Filled 2023-05-27: qty 1, 90d supply, fill #1
  Filled 2023-08-27: qty 1, 90d supply, fill #2

## 2023-03-05 NOTE — Progress Notes (Signed)
Specialty Pharmacy Refill Coordination Note  Isaac Dickson is a 47 y.o. male contacted today regarding refills of specialty medication(s) Onabotulinumtoxina   Patient requested Courier to Provider Office   Delivery date: 03/06/23   Verified address: [address] [place] [address], [place] [address]   Medication will be filled on 03/05/23. Sent Refill request to provider, call if any delays. Patient appt 10/18 11:15am

## 2023-03-05 NOTE — Telephone Encounter (Signed)
Pharmacy Patient Advocate Encounter  Received notification from [employer] that Prior Authorization for BOTOX 100U has been APPROVED from 10.16.24 to 10.16.25   PA #/Case ID/Reference #: [id number]

## 2023-03-05 NOTE — Telephone Encounter (Signed)
Pharmacy Patient Advocate Encounter   Received notification from Phone that prior authorization for BOTOX 100 is required/requested.   Insurance verification completed.   The patient is insured through [employer] .   Per test claim: PA required; PA submitted to [employer] via CoverMyMeds Key/confirmation #/EOC [id number] Status is pending

## 2023-03-05 NOTE — Progress Notes (Signed)
Botox is requiring PA. Messaged [place]. If PA not approved by 10/17, notify office that they will have to use a sample for 10/18 appointment.

## 2023-03-06 ENCOUNTER — Other Ambulatory Visit: Payer: Self-pay

## 2023-03-07 ENCOUNTER — Other Ambulatory Visit: Payer: Self-pay

## 2023-03-07 ENCOUNTER — Ambulatory Visit: Payer: Managed Care, Other (non HMO) | Admitting: Neurology

## 2023-03-07 DIAGNOSIS — G5132 Clonic hemifacial spasm, left: Secondary | ICD-10-CM

## 2023-03-07 MED ORDER — ONABOTULINUMTOXINA 100 UNITS IJ SOLR
100.0000 [IU] | Freq: Once | INTRAMUSCULAR | Status: AC
  Administered 2023-03-07: 20 [IU] via INTRAMUSCULAR

## 2023-03-07 NOTE — Procedures (Signed)
Botulinum Clinic   History:  Diagnosis: Hemifacial spasm   Initial side: left   Result History  Happy with results  Consent obtained from: The patient Benefits discussed included, but were not limited to decreased muscle tightness, increased joint range of motion, and decreased pain.  Risk discussed included, but were not limited pain and discomfort, bleeding, bruising, excessive weakness, venous thrombosis, muscle atrophy and dysphagia.  A copy of the patient medication guide was given to the patient which explains the blackbox warning.  Patients identity and treatment sites confirmed Yes.  .  Details of Procedure: Skin was cleaned with alcohol.  A 30 gauge, 1/2 inch needle was introduced to the target muscle.  Prior to injection, the needle plunger was aspirated to make sure the needle was not within a blood vessel.  There was no blood retrieved on aspiration.    Following is a summary of the muscles injected  And the amount of Botulinum toxin used:  Injections  Location Left  Right Units Number of sites        Corrugator      Frontalis      Lower Lid, Lateral 2.5  2.5 1  Lower Lid Medial 2.5  2.5 1  Upper Lid, Lateral 2.5  2.5 1  Upper Lid, Medial      Canthus 5.0  5.0 1  nasalis 2.5  2.5 1  Masseter      Procerus      Zygomaticus Major 2.5/2.5  5.0 2  TOTAL UNITS:   20    Agent: Botulinum Type A ( Onobotulinum Toxin type A ).  1 vials of Botox were used, each containing 50 units and freshly diluted with 2 mL of sterile, non-perserved saline   Total injected (Units): 20  Total wasted (Units): 80 Pt tolerated procedure well without complications.   Reinjection is anticipated in 3 months.

## 2023-03-26 ENCOUNTER — Other Ambulatory Visit: Payer: Self-pay

## 2023-05-13 ENCOUNTER — Other Ambulatory Visit: Payer: Self-pay

## 2023-05-23 ENCOUNTER — Other Ambulatory Visit: Payer: Self-pay

## 2023-05-26 ENCOUNTER — Other Ambulatory Visit: Payer: Self-pay

## 2023-05-27 ENCOUNTER — Other Ambulatory Visit (HOSPITAL_COMMUNITY): Payer: Self-pay | Admitting: Pharmacy Technician

## 2023-05-27 ENCOUNTER — Other Ambulatory Visit (HOSPITAL_COMMUNITY): Payer: Self-pay

## 2023-05-27 NOTE — Progress Notes (Signed)
 Specialty Pharmacy Refill Coordination Note  Isaac Dickson is a 48 y.o. male contacted today regarding refills of specialty medication(s) OnabotulinumtoxinA  (Botox )   Patient requested Courier to Provider Office   Delivery date: 06/02/23   Verified address: [facility] [address]   Medication will be filled on 05/30/23.

## 2023-05-30 ENCOUNTER — Other Ambulatory Visit: Payer: Self-pay

## 2023-06-01 ENCOUNTER — Other Ambulatory Visit (HOSPITAL_COMMUNITY): Payer: Self-pay

## 2023-06-06 ENCOUNTER — Ambulatory Visit: Payer: Managed Care, Other (non HMO) | Admitting: Neurology

## 2023-06-06 DIAGNOSIS — G5132 Clonic hemifacial spasm, left: Secondary | ICD-10-CM | POA: Diagnosis not present

## 2023-06-06 MED ORDER — ONABOTULINUMTOXINA 100 UNITS IJ SOLR
100.0000 [IU] | Freq: Once | INTRAMUSCULAR | Status: AC
  Administered 2023-06-06: 20 [IU] via INTRAMUSCULAR

## 2023-06-06 NOTE — Procedures (Signed)
Botulinum Clinic   History:  Diagnosis: Hemifacial spasm   Initial side: left   Result History  Happy with results; wore off about a week ago  Consent obtained from: The patient Benefits discussed included, but were not limited to decreased muscle tightness, increased joint range of motion, and decreased pain.  Risk discussed included, but were not limited pain and discomfort, bleeding, bruising, excessive weakness, venous thrombosis, muscle atrophy and dysphagia.  A copy of the patient medication guide was given to the patient which explains the blackbox warning.  Patients identity and treatment sites confirmed Yes.  .  Details of Procedure: Skin was cleaned with alcohol.  A 30 gauge, 1/2 inch needle was introduced to the target muscle.  Prior to injection, the needle plunger was aspirated to make sure the needle was not within a blood vessel.  There was no blood retrieved on aspiration.    Following is a summary of the muscles injected  And the amount of Botulinum toxin used:  Injections  Location Left  Right Units Number of sites        Corrugator      Frontalis      Lower Lid, Lateral 2.5  2.5 1  Lower Lid Medial 2.5  2.5 1  Upper Lid, Lateral 2.5  2.5 1  Upper Lid, Medial      Canthus 5.0  5.0 1  nasalis 2.5  2.5 1  Masseter      Procerus      Zygomaticus Major 2.5/2.5  5.0 2  TOTAL UNITS:   20    Agent: Botulinum Type A ( Onobotulinum Toxin type A ).  1 vials of Botox were used, each containing 50 units and freshly diluted with 2 mL of sterile, non-perserved saline   Total injected (Units): 20  Total wasted (Units): 80 Pt tolerated procedure well without complications.   Reinjection is anticipated in 3 months.

## 2023-07-07 ENCOUNTER — Other Ambulatory Visit (HOSPITAL_COMMUNITY): Payer: Self-pay

## 2023-07-07 NOTE — Telephone Encounter (Signed)
 Pharmacy Patient Advocate Encounter- Botox BIV-Pharmacy Benefit:  PA was submitted to [employer] and has been approved through: 10.16.24 TO 10.16.25 Authorization# [id number]  Please send prescription to Specialty Pharmacy: [facility]: [phone]  Estimated Copay is: $0  Admin Code: [id number] DOES require Prior Auth.  Admin Code [id number]#[id number]  Patient IS eligible for Botox Copay Card, which will make patient's copay as little as zero. Copay card will be provided to pharmacy.

## 2023-08-07 ENCOUNTER — Other Ambulatory Visit: Payer: Self-pay

## 2023-08-26 ENCOUNTER — Other Ambulatory Visit (HOSPITAL_COMMUNITY): Payer: Self-pay

## 2023-08-27 ENCOUNTER — Other Ambulatory Visit: Payer: Self-pay | Admitting: Pharmacy Technician

## 2023-08-27 ENCOUNTER — Other Ambulatory Visit: Payer: Self-pay

## 2023-08-27 NOTE — Progress Notes (Signed)
 Specialty Pharmacy Refill Coordination Note  Isaac Dickson is a 48 y.o. male contacted today regarding refills of specialty medication(s) OnabotulinumtoxinA (Botox)   Patient requested Courier to Provider Office   Delivery date: 09/10/23   Verified address: [facility] [address] [place] [address]  [place] [address]   Medication will be filled on 09/10/23.

## 2023-09-04 ENCOUNTER — Other Ambulatory Visit (HOSPITAL_COMMUNITY): Payer: Self-pay

## 2023-09-10 ENCOUNTER — Other Ambulatory Visit: Payer: Self-pay

## 2023-09-11 ENCOUNTER — Other Ambulatory Visit: Payer: Self-pay

## 2023-09-11 ENCOUNTER — Telehealth: Payer: Self-pay | Admitting: Neurology

## 2023-09-11 ENCOUNTER — Other Ambulatory Visit (HOSPITAL_COMMUNITY): Payer: Self-pay

## 2023-09-11 NOTE — Telephone Encounter (Signed)
 Can we try to change him to Xeomin next time, 50 unit vial

## 2023-09-12 ENCOUNTER — Telehealth: Payer: Self-pay | Admitting: Pharmacy Technician

## 2023-09-12 ENCOUNTER — Other Ambulatory Visit (HOSPITAL_COMMUNITY): Payer: Self-pay

## 2023-09-12 ENCOUNTER — Ambulatory Visit (INDEPENDENT_AMBULATORY_CARE_PROVIDER_SITE_OTHER): Payer: Managed Care, Other (non HMO) | Admitting: Neurology

## 2023-09-12 DIAGNOSIS — G5132 Clonic hemifacial spasm, left: Secondary | ICD-10-CM | POA: Diagnosis not present

## 2023-09-12 MED ORDER — ONABOTULINUMTOXINA 100 UNITS IJ SOLR
100.0000 [IU] | Freq: Once | INTRAMUSCULAR | Status: AC
  Administered 2023-09-12: 20 [IU] via INTRAMUSCULAR

## 2023-09-12 NOTE — Telephone Encounter (Signed)
 Pharmacy Patient Advocate Encounter  Pharmacy Benefit PA has been submitted for Xeomin- [id number] via CoverMyMeds.  INSURANCE: [employer] KEY/EOC/FAX: [id number] Procedure code [address]   PENDING  require a PA Status is Submitted

## 2023-09-12 NOTE — Telephone Encounter (Signed)
 PA has been submitted, and telephone encounter has been created. Please see telephone encounter dated 4.25.25.

## 2023-09-12 NOTE — Procedures (Signed)
 Botulinum Clinic   History:  Diagnosis: Hemifacial spasm   Initial side: left   Result History  Doing well.  Some twitching just started x 1 week  Consent obtained from: The patient Benefits discussed included, but were not limited to decreased muscle tightness, increased joint range of motion, and decreased pain.  Risk discussed included, but were not limited pain and discomfort, bleeding, bruising, excessive weakness, venous thrombosis, muscle atrophy and dysphagia.  A copy of the patient medication guide was given to the patient which explains the blackbox warning.  Patients identity and treatment sites confirmed Yes.  .  Details of Procedure: Skin was cleaned with alcohol.  A 30 gauge, 1/2 inch needle was introduced to the target muscle.  Prior to injection, the needle plunger was aspirated to make sure the needle was not within a blood vessel.  There was no blood retrieved on aspiration.    Following is a summary of the muscles injected  And the amount of Botulinum toxin used:  Injections  Location Left  Right Units Number of sites        Corrugator      Frontalis      Lower Lid, Lateral 2.5  2.5 1  Lower Lid Medial 2.5  2.5 1  Upper Lid, Lateral 2.5  2.5 1  Upper Lid, Medial      Canthus 5.0  5.0 1  nasalis 2.5  2.5 1  Masseter      Procerus      Zygomaticus Major 2.5/2.5  5.0 2  TOTAL UNITS:   20    Agent: Botulinum Type A ( Onobotulinum Toxin type A ).  1 vials of Botox  were used, each containing 50 units and freshly diluted with 2 mL of sterile, non-perserved saline   Total injected (Units): 20  Total wasted (Units): 80 Pt tolerated procedure well without complications.   Reinjection is anticipated in 3 months.

## 2023-09-16 NOTE — Telephone Encounter (Signed)
 These are the options I have for diagnosis. If I choose Blepharospam, the next questions I don't have supporting documentation for, and if I choose "Other" which is what I have to choose, I must provide diagnosis, and clinical support for the diagnosis. Please provide diagnosis and documentation to support request.

## 2023-09-17 NOTE — Telephone Encounter (Signed)
 Ok, cool beans. Let me know when that has been updated, and I will resubmit with the supporting documentation provided. Thanks ma'am!

## 2023-12-02 ENCOUNTER — Other Ambulatory Visit: Payer: Self-pay

## 2023-12-02 ENCOUNTER — Other Ambulatory Visit: Payer: Self-pay | Admitting: Neurology

## 2023-12-02 DIAGNOSIS — G5132 Clonic hemifacial spasm, left: Secondary | ICD-10-CM

## 2023-12-03 ENCOUNTER — Other Ambulatory Visit (HOSPITAL_COMMUNITY): Payer: Self-pay

## 2023-12-03 ENCOUNTER — Other Ambulatory Visit: Payer: Self-pay

## 2023-12-03 MED ORDER — BOTOX 100 UNITS IJ SOLR
INTRAMUSCULAR | 2 refills | Status: AC
  Filled 2023-12-03: qty 1, fill #0
  Filled 2023-12-05: qty 1, 90d supply, fill #0
  Filled 2024-03-16: qty 1, 90d supply, fill #1
  Filled 2024-06-09: qty 1, 90d supply, fill #2

## 2023-12-04 ENCOUNTER — Other Ambulatory Visit (HOSPITAL_COMMUNITY): Payer: Self-pay

## 2023-12-05 ENCOUNTER — Other Ambulatory Visit: Payer: Self-pay | Admitting: Pharmacy Technician

## 2023-12-05 ENCOUNTER — Other Ambulatory Visit: Payer: Self-pay

## 2023-12-05 NOTE — Progress Notes (Signed)
 Specialty Pharmacy Refill Coordination Note  Isaac Dickson is a 48 y.o. male contacted today regarding refills of specialty medication(s) OnabotulinumtoxinA  (Botox )   Patient requested Courier to Provider Office   Delivery date: 12/08/23   Verified address: [facility] [address] [place] [address]   Medication will be filled on 12/05/23.  Injection Appointment 12/12/23

## 2023-12-08 ENCOUNTER — Other Ambulatory Visit: Payer: Self-pay

## 2023-12-12 ENCOUNTER — Encounter: Payer: Self-pay | Admitting: Neurology

## 2023-12-12 ENCOUNTER — Ambulatory Visit: Admitting: Neurology

## 2023-12-12 DIAGNOSIS — Z029 Encounter for administrative examinations, unspecified: Secondary | ICD-10-CM

## 2024-01-29 ENCOUNTER — Telehealth: Payer: Self-pay | Admitting: Pharmacy Technician

## 2024-01-29 ENCOUNTER — Other Ambulatory Visit (HOSPITAL_COMMUNITY): Payer: Self-pay

## 2024-01-29 NOTE — Telephone Encounter (Signed)
 Pharmacy Patient Advocate Encounter   Received notification from Fax that prior authorization for BOTOX  200 is required/requested.   Insurance verification completed.   The patient is insured through [employer] .   Per test claim: PA required; PA submitted to above mentioned insurance via Latent Key/confirmation #/EOC [id number] Status is pending

## 2024-02-03 ENCOUNTER — Other Ambulatory Visit (HOSPITAL_COMMUNITY): Payer: Self-pay

## 2024-02-04 ENCOUNTER — Other Ambulatory Visit (HOSPITAL_COMMUNITY): Payer: Self-pay

## 2024-02-04 NOTE — Telephone Encounter (Signed)
 Pharmacy Patient Advocate Encounter  Received notification from [employer] that Prior Authorization for BOTOX   has been APPROVED from 9.17.25 to 9.17.26   PA #/Case ID/Reference #: [id number]

## 2024-02-26 ENCOUNTER — Other Ambulatory Visit: Payer: Self-pay

## 2024-03-12 ENCOUNTER — Ambulatory Visit: Admitting: Neurology

## 2024-03-16 ENCOUNTER — Other Ambulatory Visit: Payer: Self-pay | Admitting: Pharmacy Technician

## 2024-03-16 ENCOUNTER — Other Ambulatory Visit: Payer: Self-pay

## 2024-03-16 NOTE — Progress Notes (Signed)
 Specialty Pharmacy Refill Coordination Note  Isaac Dickson is a 48 y.o. male contacted today regarding refills of specialty medication(s) OnabotulinumtoxinA  (Botox )   Patient requested Courier to Provider Office   Delivery date: 03/22/24   Verified address: [facility] [address] [place] [address]   Medication will be filled on: 03/19/24

## 2024-03-18 ENCOUNTER — Other Ambulatory Visit: Payer: Self-pay

## 2024-03-22 ENCOUNTER — Telehealth: Payer: Self-pay | Admitting: Neurology

## 2024-03-22 NOTE — Telephone Encounter (Signed)
 Pt called an informed that Botox  is in the office . Pt is asking if his no show fee of $100 can be waved he said he had never missed an appointment he was out of town working and it slipped his mind. PT was advised I would have to send this to someone else and they would reach out to him

## 2024-03-22 NOTE — Telephone Encounter (Signed)
 Fahim(self) called stating that he has not received a call from the people that send out his Botox . He stated he has an appt Friday for Botox  injection. He is requesting the number for the people that normal sends out his stuff.   PH: [phone]

## 2024-03-22 NOTE — Telephone Encounter (Signed)
 I called to see if I could help patient with the 100 no show fee , the VM s was full so I was unable to leave a message. I will try back at later time

## 2024-03-26 ENCOUNTER — Ambulatory Visit: Admitting: Neurology

## 2024-03-26 DIAGNOSIS — G5132 Clonic hemifacial spasm, left: Secondary | ICD-10-CM

## 2024-03-26 MED ORDER — ONABOTULINUMTOXINA 100 UNITS IJ SOLR
100.0000 [IU] | Freq: Once | INTRAMUSCULAR | Status: AC
  Administered 2024-03-26: 20 [IU] via INTRAMUSCULAR

## 2024-03-26 NOTE — Procedures (Signed)
 [facility]   History:  Diagnosis: Hemifacial spasm   Initial side: left   Result History  Has not had injection for 7 months because of missed appointment, so noticing significant symptoms.  Consent obtained from: The patient Benefits discussed included, but were not limited to decreased muscle tightness, increased joint range of motion, and decreased pain.  Risk discussed included, but were not limited pain and discomfort, bleeding, bruising, excessive weakness, venous thrombosis, muscle atrophy and dysphagia.  A copy of the patient medication guide was given to the patient which explains the blackbox warning.  Patients identity and treatment sites confirmed Yes.  .  Details of Procedure: Skin was cleaned with alcohol.  A 30 gauge, 1/2 inch needle was introduced to the target muscle.  Prior to injection, the needle plunger was aspirated to make sure the needle was not within a blood vessel.  There was no blood retrieved on aspiration.    Following is a summary of the muscles injected  And the amount of Botulinum toxin used:  Injections  Location Left  Right Units Number of sites        Corrugator      Frontalis      Lower Lid, Lateral 2.5  2.5 1  Lower Lid Medial 2.5  2.5 1  Upper Lid, Lateral 2.5  2.5 1  Upper Lid, Medial      Canthus 5.0  5.0 1  nasalis 2.5  2.5 1  [place]      Procerus      Zygomaticus Major 2.5/2.5  5.0 2  TOTAL UNITS:   20    Agent: Botulinum Type A ( Onobotulinum Toxin type A ).  1 vials of Botox  were used, each containing 50 units and freshly diluted with 2 mL of sterile, non-perserved saline   Total injected (Units): 20  Total wasted (Units): 80 Pt tolerated procedure well without complications.   Reinjection is anticipated in 3 months.

## 2024-06-09 ENCOUNTER — Other Ambulatory Visit: Payer: Self-pay

## 2024-06-09 NOTE — Progress Notes (Signed)
 Specialty Pharmacy Refill Coordination Note  Isaac Dickson is a 49 y.o. male contacted today regarding refills of specialty medication(s) OnabotulinumtoxinA  (Botox )   Patient requested Courier to Provider Office   Delivery date: 06/21/24   Verified address: [facility] [address] [place] [address]   Medication will be filled on: 06/18/24

## 2024-06-18 ENCOUNTER — Other Ambulatory Visit: Payer: Self-pay

## 2024-06-18 NOTE — Progress Notes (Signed)
 Due to impending winter weather conditions specialty medication(s) OnabotulinumtoxinA  (Botox )  will be filled 06/23/24 to be Courier to Provider Office by 06/24/24.

## 2024-06-23 ENCOUNTER — Other Ambulatory Visit: Payer: Self-pay

## 2024-06-25 ENCOUNTER — Ambulatory Visit: Admitting: Neurology

## 2024-06-25 ENCOUNTER — Encounter: Payer: Self-pay | Admitting: Neurology

## 2024-06-28 ENCOUNTER — Ambulatory Visit: Payer: Self-pay | Admitting: Neurology

## 2024-09-24 ENCOUNTER — Ambulatory Visit: Payer: Self-pay | Admitting: Neurology
